# Patient Record
Sex: Female | Born: 1980
Health system: Southern US, Community
[De-identification: ages and names within clinical notes are randomized; demographics above are authoritative.]

## PROBLEM LIST (undated history)

## (undated) DIAGNOSIS — H547 Unspecified visual loss: Secondary | ICD-10-CM

## (undated) DIAGNOSIS — K219 Gastro-esophageal reflux disease without esophagitis: Secondary | ICD-10-CM

## (undated) DIAGNOSIS — R0600 Dyspnea, unspecified: Secondary | ICD-10-CM

## (undated) DIAGNOSIS — D649 Anemia, unspecified: Secondary | ICD-10-CM

## (undated) DIAGNOSIS — E538 Deficiency of other specified B group vitamins: Secondary | ICD-10-CM

## (undated) DIAGNOSIS — T7840XA Allergy, unspecified, initial encounter: Secondary | ICD-10-CM

## (undated) DIAGNOSIS — G4730 Sleep apnea, unspecified: Secondary | ICD-10-CM

## (undated) DIAGNOSIS — I10 Essential (primary) hypertension: Secondary | ICD-10-CM

## (undated) HISTORY — PX: TONSILLECTOMY: SUR1361

---
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
## 2013-12-09 HISTORY — PX: TUBAL LIGATION: SHX77

## 2018-02-04 ENCOUNTER — Emergency Department (HOSPITAL_COMMUNITY)
Admission: EM | Admit: 2018-02-04 | Discharge: 2018-02-04 | Disposition: A | Discharge status: Home / Self Care | Payer: Worker's Compensation | Attending: Emergency Medicine | Admitting: Emergency Medicine

## 2018-02-04 ENCOUNTER — Emergency Department (HOSPITAL_COMMUNITY): Payer: Worker's Compensation

## 2018-02-04 ENCOUNTER — Other Ambulatory Visit: Payer: Self-pay

## 2018-02-04 ENCOUNTER — Encounter (HOSPITAL_COMMUNITY): Payer: Self-pay | Admitting: Emergency Medicine

## 2018-02-04 DIAGNOSIS — I10 Essential (primary) hypertension: Secondary | ICD-10-CM | POA: Insufficient documentation

## 2018-02-04 DIAGNOSIS — Y9289 Other specified places as the place of occurrence of the external cause: Secondary | ICD-10-CM | POA: Diagnosis not present

## 2018-02-04 DIAGNOSIS — Y9389 Activity, other specified: Secondary | ICD-10-CM | POA: Diagnosis not present

## 2018-02-04 DIAGNOSIS — W273XXA Contact with needle (sewing), initial encounter: Secondary | ICD-10-CM | POA: Insufficient documentation

## 2018-02-04 DIAGNOSIS — Y990 Civilian activity done for income or pay: Secondary | ICD-10-CM | POA: Insufficient documentation

## 2018-02-04 DIAGNOSIS — S61349A Puncture wound with foreign body of unspecified finger with damage to nail, initial encounter: Secondary | ICD-10-CM | POA: Insufficient documentation

## 2018-02-04 HISTORY — DX: Unspecified visual loss: H54.7

## 2018-02-04 HISTORY — DX: Essential (primary) hypertension: I10

## 2018-02-04 LAB — RAPID URINE DRUG SCREEN, HOSP PERFORMED
Amphetamines: NOT DETECTED
BARBITURATES: NOT DETECTED
Benzodiazepines: NOT DETECTED
Cocaine: NOT DETECTED
Opiates: NOT DETECTED
Tetrahydrocannabinol: NOT DETECTED

## 2018-02-04 MED ORDER — HYDROCODONE-ACETAMINOPHEN 5-325 MG PO TABS
1.0000 | ORAL_TABLET | Freq: Once | ORAL | Status: AC
  Administered 2018-02-04: 1 via ORAL
  Filled 2018-02-04: qty 1

## 2018-02-04 MED ORDER — DOXYCYCLINE HYCLATE 100 MG PO CAPS: 100.0000 mg | ORAL_CAPSULE | Freq: Two times a day (BID) | ORAL | 0 refills | Status: DC

## 2018-02-04 MED ORDER — LIDOCAINE HCL (PF) 1 % IJ SOLN
5.0000 mL | Freq: Once | INTRAMUSCULAR | Status: AC
  Administered 2018-02-04: 5 mL via INTRADERMAL
  Filled 2018-02-04: qty 30

## 2018-02-04 MED ORDER — IBUPROFEN 800 MG PO TABS: 800.0000 mg | ORAL_TABLET | Freq: Three times a day (TID) | ORAL | 0 refills | Status: DC

## 2018-02-04 MED ORDER — HYDROCODONE-ACETAMINOPHEN 5-325 MG PO TABS: 1.0000 | ORAL_TABLET | Freq: Four times a day (QID) | ORAL | 0 refills | Status: DC | PRN

## 2018-02-04 NOTE — Discharge Instructions (Signed)
Take antibiotics as prescribed. Take the entire course, even if your symptoms improve.  Take ibuprofen 3 times a day with meals.  Do not take other anti-inflammatories at the same time open (Advil, Motrin, naproxen, Aleve).  Take norco as needed for severe pain.  Use ice packs as frequently as possible for pain and swelling.  Wear the finger splint for protection and as needed.  Follow up with Dr. Melvyn Novasrtmann for further evaluation.  Return to the ER if you develop fevers, chills, severe worsening pain, inability to move your finger, or any new or concerning symptoms.

## 2018-02-04 NOTE — ED Triage Notes (Signed)
Per [facility] pt coming from [facility], was using sewing machine and has needle in her right middle finger.

## 2018-02-04 NOTE — ED Notes (Signed)
Pt has some bloody drainage to rt middle finger foreign object is visibly impaled. Pt reports 8/10 aches thorbbing pain. Pt has hand in ice. Area is not actively bleeding at this time.

## 2018-02-04 NOTE — ED Notes (Signed)
Finger cleansed and wound care performed.

## 2018-02-04 NOTE — ED Provider Notes (Signed)
[facility]-EMERGENCY DEPT Provider Note   CSN: [id number] Arrival date & time: 02/04/18  1526     History   Chief Complaint No chief complaint on file.   HPI Alexandra Rose is a 37 y.o. female presenting for evaluation of sewing needle stuck in her nail.   Pt states she is blind, and works using a sewing machine. Today she had the needle pierce and break off in her finger. It is her R middle finger. She reports constant throbbing pain without radiation. She denies injury elsewhere.  She has not had anything for pain including Tylenol or ibuprofen.  She is not on blood thinners.  Tetanus was updated 3 years ago.  She is not immunocompromised. She denies numbness or inability to move her finger.   HPI  Past Medical History:  Diagnosis Date  . Blind   . Hypertension     There are no active problems to display for this patient.   History reviewed. No pertinent surgical history.  OB History    No data available       Home Medications    Prior to Admission medications   Medication Sig Start Date End Date Taking? Authorizing Provider  doxycycline (VIBRAMYCIN) 100 MG capsule Take 1 capsule (100 mg total) by mouth 2 (two) times daily. 02/04/18   Carling Liberman, PA-C  HYDROcodone-acetaminophen (NORCO/VICODIN) 5-325 MG tablet Take 1 tablet by mouth every 6 (six) hours as needed for severe pain. 02/04/18   Jalecia Leon, PA-C  ibuprofen (ADVIL,MOTRIN) 800 MG tablet Take 1 tablet (800 mg total) by mouth 3 (three) times daily with meals. 02/04/18   Oluwadarasimi Redmon, PA-C    Family History No family history on file.  Social History Social History   Tobacco Use  . Smoking status: Never Smoker  . Smokeless tobacco: Never Used  Substance Use Topics  . Alcohol use: Not on file  . Drug use: Not on file     Allergies   Patient has no known allergies.   Review of Systems Review of Systems  Skin: Positive for wound.  Hematological: Does not  bruise/bleed easily.     Physical Exam Updated Vital Signs BP (!) 152/112 (BP Location: Left Arm)   Pulse 95   Temp 98.2 F (36.8 C) (Oral)   Resp 18   Ht 5\' 7"  (1.702 m)   Wt 113.4 kg (250 lb)   LMP 01/19/2018   SpO2 100%   BMI 39.16 kg/m   Physical Exam  Constitutional: She is oriented to person, place, and time. She appears well-developed and well-nourished. No distress.  HENT:  Head: Normocephalic and atraumatic.  Eyes: EOM are normal.  Neck: Normal range of motion.  Pulmonary/Chest: Effort normal.  Abdominal: She exhibits no distension.  Musculoskeletal: Normal range of motion.  Full active range of motion of the fingers.  Strength against resistance intact.  Sensation intact.  Neurological: She is alert and oriented to person, place, and time. No sensory deficit.  Skin: Capillary refill takes less than 2 seconds.  Sewing needle puncturing the right nail.  No injury noted elsewhere.  No bleeding at this time. Good cap refill.  Psychiatric: She has a normal mood and affect.  Nursing note and vitals reviewed.    ED Treatments / Results  Labs (all labs ordered are listed, but only abnormal results are displayed) Labs Reviewed  RAPID URINE DRUG SCREEN, HOSP PERFORMED    EKG  EKG Interpretation None       Radiology  Dg Finger Middle Right  Result Date: 02/04/2018 CLINICAL DATA:  Patient coming from [facility], was using sewing machine and has needle in her right middle finger. EXAM: RIGHT MIDDLE FINGER 2+V COMPARISON:  None. FINDINGS: There is no evidence of fracture or dislocation. There is no evidence of arthropathy or other focal bone abnormality. There is a 7.5 mm metallic foreign body in tip of the right third digit which appears to violate distal-most cortex. IMPRESSION: 7.5 mm metallic foreign body in tip of the right third digit which appears to violate distal-most cortex. Electronically Signed   By: Elige Ko   On: 02/04/2018 16:28     Procedures .Foreign Body Removal Date/Time: 02/04/2018 6:19 PM Performed by: Alveria Apley, PA-C Authorized by: Alveria Apley, PA-C  Consent: Verbal consent obtained. Risks and benefits: risks, benefits and alternatives were discussed Consent given by: patient Intake: R middle finger. Anesthesia: digital block  Anesthesia: Local Anesthetic: lidocaine 1% without epinephrine Anesthetic total: 4 mL  Sedation: Patient sedated: no  Patient restrained: no Patient cooperative: yes Complexity: simple 1 objects recovered. Objects recovered: sweing needle Post-procedure assessment: foreign body removed Patient tolerance: Patient tolerated the procedure well with no immediate complications   (including critical care time)  Medications Ordered in ED Medications  lidocaine (PF) (XYLOCAINE) 1 % injection 5 mL (not administered)  HYDROcodone-acetaminophen (NORCO/VICODIN) 5-325 MG per tablet 1 tablet (1 tablet Oral Given 02/04/18 1612)     Initial Impression / Assessment and Plan / ED Course  I have reviewed the triage vital signs and the nursing notes.  Pertinent labs & imaging results that were available during my care of the patient were reviewed by me and considered in my medical decision making (see chart for details).     Patient presenting for evaluation of sewing needle puncturing the right nail.  Physical exam shows patient is neurovascularly intact.  X-ray shows sewing needle hitting the distal cortex of the bone.  Tetanus is UTD. Digital block performed, and sewing needle removed.  Patient placed on antibiotics and instructed on wound care.  Follow-up with hand surgery.  NSAIDs for pain and Norco for severe pain.  PMP checked, patient without previous prescription for narcotics.  At this time, patient appears safe for discharge.  Return precautions given.  Patient states she understands and agrees to plan.  Final Clinical Impressions(s) / ED Diagnoses   Final  diagnoses:  Puncture wound of finger with foreign body with damage to nail, initial encounter    ED Discharge Orders        Ordered    doxycycline (VIBRAMYCIN) 100 MG capsule  2 times daily     02/04/18 1746    ibuprofen (ADVIL,MOTRIN) 800 MG tablet  3 times daily with meals     02/04/18 1746    HYDROcodone-acetaminophen (NORCO/VICODIN) 5-325 MG tablet  Every 6 hours PRN     02/04/18 1746       Jamielynn Wigley, PA-C 02/04/18 1829    Donnetta Hutching, MD 02/06/18 2046

## 2018-05-01 DIAGNOSIS — H548 Legal blindness, as defined in USA: Secondary | ICD-10-CM | POA: Diagnosis not present

## 2018-05-01 DIAGNOSIS — Z0000 Encounter for general adult medical examination without abnormal findings: Secondary | ICD-10-CM | POA: Diagnosis not present

## 2018-05-01 DIAGNOSIS — Z13228 Encounter for screening for other metabolic disorders: Secondary | ICD-10-CM | POA: Diagnosis not present

## 2018-05-01 DIAGNOSIS — H3552 Pigmentary retinal dystrophy: Secondary | ICD-10-CM | POA: Diagnosis not present

## 2018-05-01 DIAGNOSIS — M549 Dorsalgia, unspecified: Secondary | ICD-10-CM | POA: Diagnosis not present

## 2018-05-01 DIAGNOSIS — N62 Hypertrophy of breast: Secondary | ICD-10-CM | POA: Diagnosis not present

## 2018-05-01 DIAGNOSIS — Z13220 Encounter for screening for lipoid disorders: Secondary | ICD-10-CM | POA: Diagnosis not present

## 2018-05-02 DIAGNOSIS — Z0000 Encounter for general adult medical examination without abnormal findings: Secondary | ICD-10-CM | POA: Diagnosis not present

## 2018-05-11 DIAGNOSIS — N62 Hypertrophy of breast: Secondary | ICD-10-CM | POA: Diagnosis not present

## 2018-10-13 DIAGNOSIS — R0681 Apnea, not elsewhere classified: Secondary | ICD-10-CM | POA: Diagnosis not present

## 2018-10-13 DIAGNOSIS — D509 Iron deficiency anemia, unspecified: Secondary | ICD-10-CM | POA: Diagnosis not present

## 2018-10-13 DIAGNOSIS — Z23 Encounter for immunization: Secondary | ICD-10-CM | POA: Diagnosis not present

## 2018-10-13 DIAGNOSIS — N939 Abnormal uterine and vaginal bleeding, unspecified: Secondary | ICD-10-CM | POA: Diagnosis not present

## 2018-10-23 DIAGNOSIS — D509 Iron deficiency anemia, unspecified: Secondary | ICD-10-CM | POA: Diagnosis not present

## 2018-10-23 DIAGNOSIS — N939 Abnormal uterine and vaginal bleeding, unspecified: Secondary | ICD-10-CM | POA: Diagnosis not present

## 2018-10-28 ENCOUNTER — Institutional Professional Consult (permissible substitution): Payer: Self-pay | Admitting: Pulmonary Disease

## 2018-10-28 DIAGNOSIS — R0602 Shortness of breath: Secondary | ICD-10-CM | POA: Diagnosis not present

## 2018-10-28 DIAGNOSIS — D500 Iron deficiency anemia secondary to blood loss (chronic): Secondary | ICD-10-CM | POA: Diagnosis not present

## 2018-10-28 DIAGNOSIS — R0789 Other chest pain: Secondary | ICD-10-CM | POA: Diagnosis not present

## 2018-10-28 DIAGNOSIS — D649 Anemia, unspecified: Secondary | ICD-10-CM | POA: Diagnosis not present

## 2018-11-02 DIAGNOSIS — D500 Iron deficiency anemia secondary to blood loss (chronic): Secondary | ICD-10-CM | POA: Diagnosis not present

## 2018-11-02 DIAGNOSIS — H3552 Pigmentary retinal dystrophy: Secondary | ICD-10-CM | POA: Diagnosis not present

## 2018-11-02 DIAGNOSIS — N921 Excessive and frequent menstruation with irregular cycle: Secondary | ICD-10-CM | POA: Diagnosis not present

## 2018-11-12 ENCOUNTER — Ambulatory Visit: Payer: Medicaid Other | Admitting: Obstetrics and Gynecology

## 2018-11-12 DIAGNOSIS — D500 Iron deficiency anemia secondary to blood loss (chronic): Secondary | ICD-10-CM | POA: Diagnosis not present

## 2018-11-19 DIAGNOSIS — D500 Iron deficiency anemia secondary to blood loss (chronic): Secondary | ICD-10-CM | POA: Diagnosis not present

## 2018-11-20 ENCOUNTER — Institutional Professional Consult (permissible substitution): Payer: Self-pay | Admitting: Pulmonary Disease

## 2018-11-20 DIAGNOSIS — D500 Iron deficiency anemia secondary to blood loss (chronic): Secondary | ICD-10-CM | POA: Diagnosis not present

## 2018-11-20 DIAGNOSIS — R040 Epistaxis: Secondary | ICD-10-CM | POA: Diagnosis not present

## 2018-11-30 ENCOUNTER — Encounter: Payer: Self-pay | Admitting: Advanced Practice Midwife

## 2018-11-30 ENCOUNTER — Ambulatory Visit (INDEPENDENT_AMBULATORY_CARE_PROVIDER_SITE_OTHER): Payer: BLUE CROSS/BLUE SHIELD | Admitting: Advanced Practice Midwife

## 2018-11-30 VITALS — BP 146/93 | HR 76 | Ht 67.0 in | Wt 312.0 lb

## 2018-11-30 DIAGNOSIS — N939 Abnormal uterine and vaginal bleeding, unspecified: Secondary | ICD-10-CM | POA: Diagnosis not present

## 2018-11-30 DIAGNOSIS — N920 Excessive and frequent menstruation with regular cycle: Secondary | ICD-10-CM | POA: Insufficient documentation

## 2018-11-30 DIAGNOSIS — D500 Iron deficiency anemia secondary to blood loss (chronic): Secondary | ICD-10-CM

## 2018-11-30 MED ORDER — MEGESTROL ACETATE 40 MG PO TABS: 40.0000 mg | ORAL_TABLET | Freq: Two times a day (BID) | ORAL | 3 refills | Status: DC

## 2018-11-30 NOTE — Patient Instructions (Signed)
Abnormal Uterine Bleeding  Abnormal uterine bleeding is unusual bleeding from the uterus. It includes:   Bleeding or spotting between periods.   Bleeding after sex.   Bleeding that is heavier than normal.   Periods that last longer than usual.   Bleeding after menopause.  Abnormal uterine bleeding can affect women at various stages in life, including teenagers, women in their reproductive years, pregnant women, and women who have reached menopause. Common causes of abnormal uterine bleeding include:   Pregnancy.   Growths of tissue (polyps).   A noncancerous tumor in the uterus (fibroid).   Infection.   Cancer.   Hormonal imbalances.  Any type of abnormal bleeding should be evaluated by a health care provider. Many cases are minor and simple to treat, while others are more serious. Treatment will depend on the cause of the bleeding.  Follow these instructions at home:   Monitor your condition for any changes.   Do not use tampons, douche, or have sex if told by your health care provider.   Change your pads often.   Get regular exams that include pelvic exams and cervical cancer screening.   Keep all follow-up visits as told by your health care provider. This is important.  Contact a health care provider if:   Your bleeding lasts for more than one week.   You feel dizzy at times.   You feel nauseous or you vomit.  Get help right away if:   You pass out.   Your bleeding soaks through a pad every hour.   You have abdominal pain.   You have a fever.   You become sweaty or weak.   You pass large blood clots from your vagina.  Summary   Abnormal uterine bleeding is unusual bleeding from the uterus.   Any type of abnormal bleeding should be evaluated by a health care provider. Many cases are minor and simple to treat, while others are more serious.   Treatment will depend on the cause of the bleeding.  This information is not intended to replace advice given to you by your health care provider.  Make sure you discuss any questions you have with your health care provider.  Document Released: 11/25/2005 Document Revised: 12/27/2016 Document Reviewed: 12/27/2016  Elsevier Interactive Patient Education  2019 [employer]

## 2018-11-30 NOTE — Progress Notes (Signed)
New Patient states that she has AUB. Pt reports heavy bleeding and clots with monthly cycle LMP 11-30-18. Pt states she changes pad 10-15 times a day. Hx of BTL 2014

## 2018-11-30 NOTE — Progress Notes (Signed)
  GYNECOLOGY PROGRESS NOTE  History:  37 y.o. G2P0 [facility] problem gyn visit. She reports heavy regular menses with some bleeding in between.  Her primary care doctor sent her to OB/Gyn because she has Hgb of 7 and is getting iron transfusions.  She is in a new relationship and is considering having tubal reversal surgery and planning another pregnancy.  She has heavy periods requiring 6-7 overnight pads every 24 hours that last for 3-4 days.  In between she sometimes has more bleeding but it is lighter than her period.  She reports some fatigue but no SOB, chest pain, or dizziness.    The following portions of the patient's history were reviewed and updated as appropriate: allergies, current medications, past family history, past medical history, past social history, past surgical history and problem list.   Review of Systems:  Pertinent items are noted in HPI.   Objective:  Physical Exam Blood pressure (!) 146/93, pulse 76, height 5\' 7"  (1.702 m), weight (!) 141.5 kg, last menstrual period 11/30/2018. VS reviewed, nursing note reviewed,  Constitutional: well developed, well nourished, no distress HEENT: normocephalic CV: normal rate Pulm/chest wall: normal effort Breast Exam: deferred Abdomen: soft Neuro: alert and oriented x 3 Skin: warm, dry Psych: affect normal Pelvic exam: Cervix pink, visually closed, without lesion, scant white creamy discharge, vaginal walls and external genitalia normal Bimanual exam: Cervix 0/long/high, firm, anterior, neg CMT, uterus nontender, nonenlarged, adnexa without tenderness, enlargement, or mass  Assessment & Plan:  1. Abnormal uterine bleeding (AUB) --Discussed options to manage AUB with pt.  She may desire another pregnancy.  She has HTN today in the office.  Pt has used IUD in past for contraception before BTL but IUD x 3 were expelled.   --Will start on Megace 40 mg BID. Pt to f/u in 2 months with MD.  If not improved,  consider endometrial biopsy. - megestrol (MEGACE) 40 MG tablet; Take 1 tablet (40 mg total) by mouth 2 (two) times daily.  Dispense: 30 tablet; Refill: 3  Sharen CounterLisa Leftwich-Kirby, CNM 3:50 PM

## 2018-12-10 ENCOUNTER — Institutional Professional Consult (permissible substitution): Payer: Self-pay | Admitting: Pulmonary Disease

## 2019-02-01 ENCOUNTER — Ambulatory Visit (INDEPENDENT_AMBULATORY_CARE_PROVIDER_SITE_OTHER): Payer: BLUE CROSS/BLUE SHIELD | Admitting: Obstetrics and Gynecology

## 2019-02-01 ENCOUNTER — Other Ambulatory Visit (HOSPITAL_COMMUNITY)
Admission: RE | Admit: 2019-02-01 | Discharge: 2019-02-01 | Disposition: A | Discharge status: Home / Self Care | Payer: BLUE CROSS/BLUE SHIELD | Source: Ambulatory Visit | Attending: Obstetrics and Gynecology | Admitting: Obstetrics and Gynecology

## 2019-02-01 ENCOUNTER — Encounter: Payer: Self-pay | Admitting: Obstetrics and Gynecology

## 2019-02-01 VITALS — BP 140/90 | Wt 310.0 lb

## 2019-02-01 DIAGNOSIS — Z131 Encounter for screening for diabetes mellitus: Secondary | ICD-10-CM | POA: Diagnosis not present

## 2019-02-01 DIAGNOSIS — Z124 Encounter for screening for malignant neoplasm of cervix: Secondary | ICD-10-CM | POA: Insufficient documentation

## 2019-02-01 DIAGNOSIS — Z1329 Encounter for screening for other suspected endocrine disorder: Secondary | ICD-10-CM | POA: Diagnosis not present

## 2019-02-01 DIAGNOSIS — N938 Other specified abnormal uterine and vaginal bleeding: Secondary | ICD-10-CM

## 2019-02-01 DIAGNOSIS — N898 Other specified noninflammatory disorders of vagina: Secondary | ICD-10-CM | POA: Diagnosis not present

## 2019-02-01 NOTE — Progress Notes (Signed)
Pt states she is still taking Megace and doing well. No AUB while taking medication.

## 2019-02-01 NOTE — Progress Notes (Signed)
38 yo here for follow up on AUB. She reports improvement in her DUB with megace. She reports amenorrhea since starting megace. She is still interested in tubal reversal and is in the process of saving for procedure. Patient reports some vaginal itching occasionally. Patient is in the process of losing weight  Past Medical History:  Diagnosis Date  . Blind   . Hypertension    Past Surgical History:  Procedure Laterality Date  . CESAREAN SECTION    . CESAREAN SECTION    . TONSILLECTOMY    . TUBAL LIGATION  2015   No family history on file. Social History   Tobacco Use  . Smoking status: Never Smoker  . Smokeless tobacco: Never Used  Substance Use Topics  . Alcohol use: Not on file  . Drug use: Not on file   ROS See pertinent in HPI  Blood pressure 140/90, weight (!) 310 lb (140.6 kg), last menstrual period 11/30/2018.  GENERAL: Well-developed, well-nourished female in no acute distress.  HEENT: Normocephalic, atraumatic. Sclerae anicteric.  NECK: Supple. Normal thyroid.  LUNGS: Clear to auscultation bilaterally.  HEART: Regular rate and rhythm. BREASTS: Symmetric in size. No palpable masses or lymphadenopathy, skin changes, or nipple drainage. ABDOMEN: Soft, nontender, nondistended. No organomegaly. PELVIC: Normal external female genitalia. Vagina is pink and rugated.  Normal discharge. Normal appearing cervix. Bimanual limited secondary to body habitus EXTREMITIES: No cyanosis, clubbing, or edema, 2+ distal pulses.  A/P 38 yo with DUB  - Continue medical management with Megace - Will refer patient to infertility specialist to start tubal reversal process - patient overdue for pap smear- performed today - wet prep collected - patient scheduled to see PCP tomorrow - Patient will be contacted with abnormal results - labs today - Encouraged patient to continue her weight loss efforts - RTC in 1 year or prn

## 2019-02-02 DIAGNOSIS — R040 Epistaxis: Secondary | ICD-10-CM | POA: Diagnosis not present

## 2019-02-02 DIAGNOSIS — D500 Iron deficiency anemia secondary to blood loss (chronic): Secondary | ICD-10-CM | POA: Diagnosis not present

## 2019-02-02 DIAGNOSIS — S161XXA Strain of muscle, fascia and tendon at neck level, initial encounter: Secondary | ICD-10-CM | POA: Diagnosis not present

## 2019-02-02 LAB — CBC
HEMATOCRIT: 37.2 % (ref 34.0–46.6)
Hemoglobin: 11.7 g/dL (ref 11.1–15.9)
MCH: 25.1 pg — AB (ref 26.6–33.0)
MCHC: 31.5 g/dL (ref 31.5–35.7)
MCV: 80 fL (ref 79–97)
PLATELETS: 369 10*3/uL (ref 150–450)
RBC: 4.67 x10E6/uL (ref 3.77–5.28)
RDW: 22.2 % — AB (ref 11.7–15.4)
WBC: 9.4 10*3/uL (ref 3.4–10.8)

## 2019-02-02 LAB — COMPREHENSIVE METABOLIC PANEL
A/G RATIO: 1.3 (ref 1.2–2.2)
ALT: 21 IU/L (ref 0–32)
AST: 17 IU/L (ref 0–40)
Albumin: 4.4 g/dL (ref 3.8–4.8)
Alkaline Phosphatase: 95 IU/L (ref 39–117)
BUN/Creatinine Ratio: 12 (ref 9–23)
BUN: 10 mg/dL (ref 6–20)
CHLORIDE: 105 mmol/L (ref 96–106)
CO2: 18 mmol/L — ABNORMAL LOW (ref 20–29)
Calcium: 9.6 mg/dL (ref 8.7–10.2)
Creatinine, Ser: 0.85 mg/dL (ref 0.57–1.00)
GFR calc non Af Amer: 88 mL/min/{1.73_m2} (ref >59)
GFR, EST AFRICAN AMERICAN: 101 mL/min/{1.73_m2} (ref >59)
GLOBULIN, TOTAL: 3.4 g/dL (ref 1.5–4.5)
Glucose: 115 mg/dL — ABNORMAL HIGH (ref 65–99)
POTASSIUM: 4.4 mmol/L (ref 3.5–5.2)
SODIUM: 144 mmol/L (ref 134–144)
TOTAL PROTEIN: 7.8 g/dL (ref 6.0–8.5)

## 2019-02-02 LAB — HEMOGLOBIN A1C
Est. average glucose Bld gHb Est-mCnc: 128 mg/dL
Hgb A1c MFr Bld: 6.1 % — ABNORMAL HIGH (ref 4.8–5.6)

## 2019-02-02 LAB — TSH: TSH: 2.29 u[IU]/mL (ref 0.450–4.500)

## 2019-02-03 LAB — CERVICOVAGINAL ANCILLARY ONLY
Bacterial vaginitis: POSITIVE — AB
Candida vaginitis: NEGATIVE

## 2019-02-03 MED ORDER — METRONIDAZOLE 500 MG PO TABS: 500.0000 mg | ORAL_TABLET | Freq: Two times a day (BID) | ORAL | 0 refills | Status: DC

## 2019-02-03 NOTE — Addendum Note (Signed)
Addended by: Catalina Antigua on: 02/03/2019 03:52 PM   Modules accepted: Orders

## 2019-02-04 LAB — CYTOLOGY - PAP
DIAGNOSIS: NEGATIVE
HPV: NOT DETECTED

## 2019-02-08 DIAGNOSIS — F17200 Nicotine dependence, unspecified, uncomplicated: Secondary | ICD-10-CM | POA: Diagnosis not present

## 2019-02-08 DIAGNOSIS — R040 Epistaxis: Secondary | ICD-10-CM | POA: Diagnosis not present

## 2019-02-08 DIAGNOSIS — N920 Excessive and frequent menstruation with regular cycle: Secondary | ICD-10-CM | POA: Diagnosis not present

## 2019-02-08 DIAGNOSIS — K5900 Constipation, unspecified: Secondary | ICD-10-CM | POA: Diagnosis not present

## 2019-02-08 DIAGNOSIS — D500 Iron deficiency anemia secondary to blood loss (chronic): Secondary | ICD-10-CM | POA: Diagnosis not present

## 2019-02-09 DIAGNOSIS — Z6841 Body Mass Index (BMI) 40.0 and over, adult: Secondary | ICD-10-CM | POA: Diagnosis not present

## 2019-02-09 DIAGNOSIS — G4733 Obstructive sleep apnea (adult) (pediatric): Secondary | ICD-10-CM | POA: Diagnosis not present

## 2019-02-09 DIAGNOSIS — R040 Epistaxis: Secondary | ICD-10-CM | POA: Diagnosis not present

## 2019-02-17 ENCOUNTER — Encounter: Payer: Self-pay | Admitting: Internal Medicine

## 2019-02-17 ENCOUNTER — Telehealth: Payer: Self-pay | Admitting: Internal Medicine

## 2019-02-17 NOTE — Telephone Encounter (Signed)
Received a call from [facility] to schedule a hem appt for Alexandra Rose. An appt has been scheduled for the pt to see Dr. Melton Alar on 6/18 at 1050am. Referring office will notify the pt. Letter mailed.

## 2019-02-24 DIAGNOSIS — N760 Acute vaginitis: Secondary | ICD-10-CM | POA: Diagnosis not present

## 2019-02-24 DIAGNOSIS — R1084 Generalized abdominal pain: Secondary | ICD-10-CM | POA: Diagnosis not present

## 2019-02-24 DIAGNOSIS — K5909 Other constipation: Secondary | ICD-10-CM | POA: Diagnosis not present

## 2019-02-24 DIAGNOSIS — D500 Iron deficiency anemia secondary to blood loss (chronic): Secondary | ICD-10-CM | POA: Diagnosis not present

## 2019-03-15 DIAGNOSIS — G4733 Obstructive sleep apnea (adult) (pediatric): Secondary | ICD-10-CM | POA: Diagnosis not present

## 2019-03-15 DIAGNOSIS — R040 Epistaxis: Secondary | ICD-10-CM | POA: Diagnosis not present

## 2019-03-15 DIAGNOSIS — R0609 Other forms of dyspnea: Secondary | ICD-10-CM | POA: Diagnosis not present

## 2019-03-15 DIAGNOSIS — R0789 Other chest pain: Secondary | ICD-10-CM | POA: Diagnosis not present

## 2019-03-23 DIAGNOSIS — Z3169 Encounter for other general counseling and advice on procreation: Secondary | ICD-10-CM | POA: Diagnosis not present

## 2019-04-12 DIAGNOSIS — D509 Iron deficiency anemia, unspecified: Secondary | ICD-10-CM | POA: Diagnosis not present

## 2019-04-13 DIAGNOSIS — R0600 Dyspnea, unspecified: Secondary | ICD-10-CM | POA: Diagnosis not present

## 2019-04-13 DIAGNOSIS — D500 Iron deficiency anemia secondary to blood loss (chronic): Secondary | ICD-10-CM | POA: Diagnosis not present

## 2019-04-13 DIAGNOSIS — R040 Epistaxis: Secondary | ICD-10-CM | POA: Diagnosis not present

## 2019-04-13 DIAGNOSIS — J301 Allergic rhinitis due to pollen: Secondary | ICD-10-CM | POA: Diagnosis not present

## 2019-04-25 DIAGNOSIS — D500 Iron deficiency anemia secondary to blood loss (chronic): Secondary | ICD-10-CM | POA: Diagnosis not present

## 2019-05-04 DIAGNOSIS — D500 Iron deficiency anemia secondary to blood loss (chronic): Secondary | ICD-10-CM | POA: Diagnosis not present

## 2019-05-26 ENCOUNTER — Telehealth: Payer: Self-pay | Admitting: Internal Medicine

## 2019-05-26 NOTE — Telephone Encounter (Signed)
Pt cld and r/s appt from 6/18 to 7/2 at 1pm.

## 2019-05-27 ENCOUNTER — Encounter: Payer: Self-pay | Admitting: Internal Medicine

## 2019-05-27 ENCOUNTER — Inpatient Hospital Stay: Payer: BC Managed Care – PPO

## 2019-05-28 DIAGNOSIS — G4733 Obstructive sleep apnea (adult) (pediatric): Secondary | ICD-10-CM | POA: Diagnosis not present

## 2019-05-28 DIAGNOSIS — Z6841 Body Mass Index (BMI) 40.0 and over, adult: Secondary | ICD-10-CM | POA: Diagnosis not present

## 2019-05-28 DIAGNOSIS — H543 Unqualified visual loss, both eyes: Secondary | ICD-10-CM | POA: Diagnosis not present

## 2019-06-04 DIAGNOSIS — D500 Iron deficiency anemia secondary to blood loss (chronic): Secondary | ICD-10-CM | POA: Diagnosis not present

## 2019-06-04 DIAGNOSIS — R5383 Other fatigue: Secondary | ICD-10-CM | POA: Diagnosis not present

## 2019-06-04 DIAGNOSIS — Z319 Encounter for procreative management, unspecified: Secondary | ICD-10-CM | POA: Diagnosis not present

## 2019-06-04 DIAGNOSIS — G4733 Obstructive sleep apnea (adult) (pediatric): Secondary | ICD-10-CM | POA: Diagnosis not present

## 2019-06-04 DIAGNOSIS — N939 Abnormal uterine and vaginal bleeding, unspecified: Secondary | ICD-10-CM | POA: Diagnosis not present

## 2019-06-08 DIAGNOSIS — R5383 Other fatigue: Secondary | ICD-10-CM | POA: Diagnosis not present

## 2019-06-10 ENCOUNTER — Inpatient Hospital Stay: Payer: BC Managed Care – PPO | Attending: Internal Medicine | Admitting: Internal Medicine

## 2019-06-10 ENCOUNTER — Other Ambulatory Visit: Payer: Self-pay

## 2019-06-10 ENCOUNTER — Inpatient Hospital Stay: Payer: BC Managed Care – PPO

## 2019-06-10 ENCOUNTER — Encounter: Payer: Self-pay | Admitting: Internal Medicine

## 2019-06-10 VITALS — BP 139/95 | HR 100 | Temp 98.5°F | Resp 18 | Ht 67.0 in | Wt 320.8 lb

## 2019-06-10 DIAGNOSIS — D500 Iron deficiency anemia secondary to blood loss (chronic): Secondary | ICD-10-CM | POA: Insufficient documentation

## 2019-06-10 DIAGNOSIS — N920 Excessive and frequent menstruation with regular cycle: Secondary | ICD-10-CM

## 2019-06-10 DIAGNOSIS — F17210 Nicotine dependence, cigarettes, uncomplicated: Secondary | ICD-10-CM

## 2019-06-10 DIAGNOSIS — I10 Essential (primary) hypertension: Secondary | ICD-10-CM | POA: Insufficient documentation

## 2019-06-10 DIAGNOSIS — H5360 Unspecified night blindness: Secondary | ICD-10-CM | POA: Insufficient documentation

## 2019-06-10 DIAGNOSIS — H3552 Pigmentary retinal dystrophy: Secondary | ICD-10-CM | POA: Diagnosis not present

## 2019-06-10 DIAGNOSIS — D508 Other iron deficiency anemias: Secondary | ICD-10-CM

## 2019-06-10 LAB — CMP (CANCER CENTER ONLY)
ALT: 27 U/L (ref 0–44)
AST: 18 U/L (ref 15–41)
Albumin: 3.7 g/dL (ref 3.5–5.0)
Alkaline Phosphatase: 103 U/L (ref 38–126)
Anion gap: 12 (ref 5–15)
BUN: 9 mg/dL (ref 6–20)
CO2: 21 mmol/L — ABNORMAL LOW (ref 22–32)
Calcium: 8.7 mg/dL — ABNORMAL LOW (ref 8.9–10.3)
Chloride: 108 mmol/L (ref 98–111)
Creatinine: 0.92 mg/dL (ref 0.44–1.00)
GFR, Est AFR Am: >60 mL/min (ref >60)
GFR, Estimated: >60 mL/min (ref >60)
Glucose, Bld: 104 mg/dL — ABNORMAL HIGH (ref 70–99)
Potassium: 3.6 mmol/L (ref 3.5–5.1)
Sodium: 141 mmol/L (ref 135–145)
Total Bilirubin: 0.3 mg/dL (ref 0.3–1.2)
Total Protein: 8.5 g/dL — ABNORMAL HIGH (ref 6.5–8.1)

## 2019-06-10 LAB — CBC WITH DIFFERENTIAL (CANCER CENTER ONLY)
Abs Immature Granulocytes: 0.04 10*3/uL (ref 0.00–0.07)
Basophils Absolute: 0 10*3/uL (ref 0.0–0.1)
Basophils Relative: 0 %
Eosinophils Absolute: 0.1 10*3/uL (ref 0.0–0.5)
Eosinophils Relative: 1 %
HCT: 40.3 % (ref 36.0–46.0)
Hemoglobin: 12.5 g/dL (ref 12.0–15.0)
Immature Granulocytes: 0 %
Lymphocytes Relative: 24 %
Lymphs Abs: 2.3 10*3/uL (ref 0.7–4.0)
MCH: 27.2 pg (ref 26.0–34.0)
MCHC: 31 g/dL (ref 30.0–36.0)
MCV: 87.8 fL (ref 80.0–100.0)
Monocytes Absolute: 0.4 10*3/uL (ref 0.1–1.0)
Monocytes Relative: 4 %
Neutro Abs: 6.6 10*3/uL (ref 1.7–7.7)
Neutrophils Relative %: 71 %
Platelet Count: 299 10*3/uL (ref 150–400)
RBC: 4.59 MIL/uL (ref 3.87–5.11)
RDW: 17 % — ABNORMAL HIGH (ref 11.5–15.5)
WBC Count: 9.4 10*3/uL (ref 4.0–10.5)
nRBC: 0 % (ref 0.0–0.2)

## 2019-06-10 LAB — IRON AND TIBC
Iron: 47 ug/dL (ref 41–142)
Saturation Ratios: 16 % — ABNORMAL LOW (ref 21–57)
TIBC: 286 ug/dL (ref 236–444)
UIBC: 240 ug/dL (ref 120–384)

## 2019-06-10 LAB — LACTATE DEHYDROGENASE: LDH: 221 U/L — ABNORMAL HIGH (ref 98–192)

## 2019-06-10 LAB — VITAMIN B12: Vitamin B-12: 149 pg/mL — ABNORMAL LOW (ref 180–914)

## 2019-06-10 LAB — FOLATE: Folate: 9.8 ng/mL (ref >5.9)

## 2019-06-10 LAB — FERRITIN: Ferritin: 1018 ng/mL — ABNORMAL HIGH (ref 11–307)

## 2019-06-10 NOTE — Progress Notes (Signed)
Referring Physician:  Ralene OkSarah Gordon, PA-C and Dr. Rosezella RumpfSandrine Crane  Diagnosis Other iron deficiency anemia - Plan: CBC with Differential (Cancer Center Only), CMP (Cancer Center only), Lactate dehydrogenase (LDH), Ferritin, Iron and TIBC, Folate, Serum, Vitamin B12, Methylmalonic acid, serum, SPEP with reflex to IFE, Hemoglobinopathy evaluation  Staging Cancer Staging No matching staging information was found for the patient.  Assessment and Plan:  1.  Iron deficiency anemia.  [age] year old female referred for evaluation due to iron deficiency anemia.  Pt was [facility] by Dr. Neil Crouchrane and was last seen 02/08/2019 and was [facility]. Pt has a diagnosis of  iron deficiency felt secondary to menorrhagia. She is status post Injectafer.  She reports her LMP was in 10/2018.  She reportedly has undergone colon[place] in [place] several years ago.  She reports craving ice.  Labs done 02/01/2019 showed WBC 9.4 HB 11.7 plts 369,000.  MCV 80.  Chemistries showed K+ 4.4 Cr 0.85 normal LFTs.   Pt reports she lost her eyesight at 8 yoa due to retinitis pigmentosa.  She denies any blood in stool or urine.  Pt is seen today for consultation due to iron deficiency anemia.    Labs done today 06/10/2019  reviewed and showed WBC 9.4 HB 12.5 plts 299,000.  MCV 88.  Chemistries showed K+ 3.6 Cr 0.92 and normal LFTs.  Awaiting results of iron studies, B12, folate, MMA, SPEP and HB electrophoresis.   Pt will have phone visit follow-up in 2 weeks to go over results.  Currently HB adequate at 12.5.  May refer to GI pending iron results.    2.  Menorrhagia.  Pt reports LMP in 10/2018.  She is currently on megace.  Follow-up with GYN as directed.    3.  Blindness.  Pt reports she lost eyesight at 38 years of age due to Retinitis Pigmentosa.  Pt should follow-up with ophthalmology as directed.    4.  Pica.  Pt reports craving ice.  Likely due to history of IDA.    5.  Smoking.  She  reports this is rare.  Cessation is recommended.  Follow-up with PCP.    6.  Health maintenance.  Follow-up with PCP as directed.  Pending lab results, may refer to GI.    38 minutes spent with more than 50% spent in review of records, counseling and coordination of care.     Oncology History   No history exists.   HPI:  38 year old female referred for evaluation due to iron deficiency anemia.  [facility] was [facility] by [facility]. Neil Crouchrane and was last seen 02/08/2019 and was [facility]. Pt has a diagnosis of  Iron deficiency felt secondary to menorrhagia. She is status post Injectafer.  She reports her LMP was in 10/2018.  She reportedly has undergone colonoscopy in [place] several years ago.  She reports craving ice.  Labs done 02/01/2019 showed WBC 9.4 HB 11.7 plts 369,000.  MCV 80.  Chemistries showed K+ 4.4 Cr 0.85 normal LFTs.   Pt reports she lost her eyesight at 8 yoa due to retinitis pigmentosa.  She denies any blood in stool or urine.  Pt is seen today for consultation due to iron deficiency anemia.    Problem List Patient Active Problem List   Diagnosis Date Noted  . Anemia due to chronic blood loss [D50.0] 11/30/2018  . Abnormal uterine bleeding (AUB) [N93.9] 11/30/2018  . Menorrhagia [N92.0] 11/30/2018  Past Medical History Past Medical History:  Diagnosis Date  . Blind   . Hypertension     Past Surgical History Past Surgical History:  Procedure Laterality Date  . CESAREAN SECTION    . CESAREAN SECTION    . TONSILLECTOMY    . TUBAL LIGATION  2015    Family History History reviewed. No pertinent family history.   Social History  reports that she has been smoking. She has never used smokeless tobacco. She reports current alcohol use. She reports previous drug use.  Medications  Current Outpatient Medications:  .  Ascorbic Acid (VITAMIN C) 100 MG tablet, Vitamin C, Disp: , Rfl:  .  cetirizine (ZYRTEC) 10 MG tablet, Take by  mouth., Disp: , Rfl:  .  megestrol (MEGACE) 40 MG tablet, Take 1 tablet (40 mg total) by mouth 2 (two) times daily., Disp: 30 tablet, Rfl: 3 .  polyethylene glycol (MIRALAX / GLYCOLAX) 17 g packet, TK 17 G PO D FOR 3 DAYS, Disp: , Rfl:   Allergies Tomato and Shellfish allergy  Review of Systems Review of Systems - Oncology ROS negative other than fatigue and craving ice.     Physical Exam  Vitals Wt Readings from Last 3 Encounters:  06/10/19 (!) 320 lb 12.8 oz (145.5 kg)  02/01/19 (!) 310 lb (140.6 kg)  11/30/18 (!) 312 lb (141.5 kg)   Temp Readings from Last 3 Encounters:  06/10/19 98.5 F (36.9 C) (Oral)  02/04/18 98.2 F (36.8 C) (Oral)   BP Readings from Last 3 Encounters:  06/10/19 (!) 139/95  02/01/19 140/90  11/30/18 (!) 146/93   Pulse Readings from Last 3 Encounters:  06/10/19 100  11/30/18 76  02/04/18 95   Constitutional: Well-developed, well-nourished, and in no distress.   HENT: Head: Normocephalic and atraumatic.  Mouth/Throat: No oropharyngeal exudate. Mucosa moist. Eyes: No scleral icterus. Pt is blind.   Neck: Normal range of motion. Neck supple. No JVD present.  Cardiovascular: Normal rate, regular rhythm and normal heart sounds.  Exam reveals no gallop and no friction rub.   No murmur heard. Pulmonary/Chest: Effort normal and breath sounds normal. No respiratory distress. No wheezes.No rales.  Abdominal: Soft. Obese.  Bowel sounds are normal. No distension. There is no tenderness. There is no guarding.  Musculoskeletal: No edema or tenderness.  Lymphadenopathy: No cervical,axillary or supraclavicular adenopathy.  Neurological: Alert and oriented to person, place, and time. No cranial nerve deficit.  Skin: Skin is warm and dry. No rash noted. No erythema. No pallor.  Psychiatric: Affect and judgment normal.   Labs Appointment on 06/10/2019  Component Date Value Ref Range Status  . Sodium 06/10/2019 141  135 - 145 mmol/L Final  . Potassium  06/10/2019 3.6  3.5 - 5.1 mmol/L Final  . Chloride 06/10/2019 108  98 - 111 mmol/L Final  . CO2 06/10/2019 21* 22 - 32 mmol/L Final  . Glucose, Bld 06/10/2019 104* 70 - 99 mg/dL Final  . BUN 06/10/2019 9  6 - 20 mg/dL Final  . Creatinine 06/10/2019 0.92  0.44 - 1.00 mg/dL Final  . Calcium 06/10/2019 8.7* 8.9 - 10.3 mg/dL Final  . Total Protein 06/10/2019 8.5* 6.5 - 8.1 g/dL Final  . Albumin 06/10/2019 3.7  3.5 - 5.0 g/dL Final  . AST 06/10/2019 18  15 - 41 U/L Final  . ALT 06/10/2019 27  0 - 44 U/L Final  . Alkaline Phosphatase 06/10/2019 103  38 - 126 U/L Final  . Total Bilirubin 06/10/2019 0.3  0.3 - 1.2 mg/dL Final  . GFR, Est Non Af Am 06/10/2019 >60  >60 mL/min Final  . GFR, Est AFR Am 06/10/2019 >60  >60 mL/min Final  . Anion gap 06/10/2019 12  5 - 15 Final   Performed at [facility], [address] [place], [place] [address]  . WBC Count 06/10/2019 9.4  4.0 - 10.5 K/uL Final  . RBC 06/10/2019 4.59  3.87 - 5.11 MIL/uL Final  . Hemoglobin 06/10/2019 12.5  12.0 - 15.0 g/dL Final  . HCT 60/45/409807/01/2019 40.3  36.0 - 46.0 % Final  . MCV 06/10/2019 87.8  80.0 - 100.0 fL Final  . MCH 06/10/2019 27.2  26.0 - 34.0 pg Final  . MCHC 06/10/2019 31.0  30.0 - 36.0 g/dL Final  . RDW 11/91/478207/01/2019 17.0* 11.5 - 15.5 % Final  . Platelet Count 06/10/2019 299  150 - 400 K/uL Final  . nRBC 06/10/2019 0.0  0.0 - 0.2 % Final  . Neutrophils Relative % 06/10/2019 71  % Final  . Neutro Abs 06/10/2019 6.6  1.7 - 7.7 K/uL Final  . Lymphocytes Relative 06/10/2019 24  % Final  . Lymphs Abs 06/10/2019 2.3  0.7 - 4.0 K/uL Final  . Monocytes Relative 06/10/2019 4  % Final  . Monocytes Absolute 06/10/2019 0.4  0.1 - 1.0 K/uL Final  . Eosinophils Relative 06/10/2019 1  % Final  . Eosinophils Absolute 06/10/2019 0.1  0.0 - 0.5 K/uL Final  . Basophils Relative 06/10/2019 0  % Final  . Basophils Absolute 06/10/2019 0.0  0.0 - 0.1 K/uL Final  . Immature Granulocytes 06/10/2019 0  % Final  .  Abs Immature Granulocytes 06/10/2019 0.04  0.00 - 0.07 K/uL Final   Performed at [facility], [address] [place], [place] [address]     Pathology Orders Placed This Encounter  Procedures  . CBC with Differential (Cancer Center Only)    Standing Status:   Future    Number of Occurrences:   1    Standing Expiration Date:   06/09/2020  . CMP (Cancer Center only)    Standing Status:   Future    Number of Occurrences:   1    Standing Expiration Date:   06/09/2020  . Lactate dehydrogenase (LDH)    Standing Status:   Future    Number of Occurrences:   1    Standing Expiration Date:   06/09/2020  . Ferritin    Standing Status:   Future    Number of Occurrences:   1    Standing Expiration Date:   06/09/2020  . Iron and TIBC    Standing Status:   Future    Number of Occurrences:   1    Standing Expiration Date:   06/09/2020  . Folate, Serum    Standing Status:   Future    Number of Occurrences:   1    Standing Expiration Date:   06/09/2020  . Vitamin B12    Standing Status:   Future    Number of Occurrences:   1    Standing Expiration Date:   06/09/2020  . Methylmalonic acid, serum    Standing Status:   Future    Number of Occurrences:   1    Standing Expiration Date:   06/09/2020  . SPEP with reflex to IFE    Standing Status:   Future    Number of Occurrences:   1    Standing  Expiration Date:   06/09/2020  . Hemoglobinopathy evaluation    Standing Status:   Future    Number of Occurrences:   1    Standing Expiration Date:   06/09/2020       Ahmed PrimaVetta Tomasina Keasling MD

## 2019-06-10 NOTE — Patient Instructions (Signed)
Anemia  Anemia is a condition in which you do not have enough red blood cells or hemoglobin. Hemoglobin is a substance in red blood cells that carries oxygen. When you do not have enough red blood cells or hemoglobin (are anemic), your body cannot get enough oxygen and your organs may not work properly. As a result, you may feel very tired or have other problems. What are the causes? Common causes of anemia include:  Excessive bleeding. Anemia can be caused by excessive bleeding inside or outside the body, including bleeding from the intestine or from periods in women.  Poor nutrition.  Long-lasting (chronic) kidney, thyroid, and liver disease.  Bone marrow disorders.  Cancer and treatments for cancer.  HIV (human immunodeficiency virus) and AIDS (acquired immunodeficiency syndrome).  Treatments for HIV and AIDS.  Spleen problems.  Blood disorders.  Infections, medicines, and autoimmune disorders that destroy red blood cells. What are the signs or symptoms? Symptoms of this condition include:  Minor weakness.  Dizziness.  Headache.  Feeling heartbeats that are irregular or faster than normal (palpitations).  Shortness of breath, especially with exercise.  Paleness.  Cold sensitivity.  Indigestion.  Nausea.  Difficulty sleeping.  Difficulty concentrating. Symptoms may occur suddenly or develop slowly. If your anemia is mild, you may not have symptoms. How is this diagnosed? This condition is diagnosed based on:  Blood tests.  Your medical history.  A physical exam.  Bone marrow biopsy. Your health care provider may also check your stool (feces) for blood and may do additional testing to look for the cause of your bleeding. You may also have other tests, including:  Imaging tests, such as a CT scan or MRI.  Endoscopy.  Colonoscopy. How is this treated? Treatment for this condition depends on the cause. If you continue to lose a lot of blood, you may  need to be treated at a hospital. Treatment may include:  Taking supplements of iron, vitamin S31, or folic acid.  Taking a hormone medicine (erythropoietin) that can help to stimulate red blood cell growth.  Having a blood transfusion. This may be needed if you lose a lot of blood.  Making changes to your diet.  Having surgery to remove your spleen. Follow these instructions at home:  Take over-the-counter and prescription medicines only as told by your health care provider.  Take supplements only as told by your health care provider.  Follow any diet instructions that you were given.  Keep all follow-up visits as told by your health care provider. This is important. Contact a health care provider if:  You develop new bleeding anywhere in the body. Get help right away if:  You are very weak.  You are short of breath.  You have pain in your abdomen or chest.  You are dizzy or feel faint.  You have trouble concentrating.  You have bloody or black, tarry stools.  You vomit repeatedly or you vomit up blood. Summary  Anemia is a condition in which you do not have enough red blood cells or enough of a substance in your red blood cells that carries oxygen (hemoglobin).  Symptoms may occur suddenly or develop slowly.  If your anemia is mild, you may not have symptoms.  This condition is diagnosed with blood tests as well as a medical history and physical exam. Other tests may be needed.  Treatment for this condition depends on the cause of the anemia. This information is not intended to replace advice given to you by  your health care provider. Make sure you discuss any questions you have with your health care provider. Document Released: 01/02/2005 Document Revised: 11/07/2017 Document Reviewed: 12/27/2016 Elsevier Patient Education  2020 [employer].

## 2019-06-12 LAB — METHYLMALONIC ACID, SERUM: Methylmalonic Acid, Quantitative: 273 nmol/L (ref 0–378)

## 2019-06-14 ENCOUNTER — Telehealth: Payer: Self-pay | Admitting: Internal Medicine

## 2019-06-14 LAB — PROTEIN ELECTROPHORESIS, SERUM, WITH REFLEX
A/G Ratio: 0.9 (ref 0.7–1.7)
Albumin ELP: 3.7 g/dL (ref 2.9–4.4)
Alpha-1-Globulin: 0.3 g/dL (ref 0.0–0.4)
Alpha-2-Globulin: 1.3 g/dL — ABNORMAL HIGH (ref 0.4–1.0)
Beta Globulin: 1.3 g/dL (ref 0.7–1.3)
Gamma Globulin: 1.2 g/dL (ref 0.4–1.8)
Globulin, Total: 4.2 g/dL — ABNORMAL HIGH (ref 2.2–3.9)
Total Protein ELP: 7.9 g/dL (ref 6.0–8.5)

## 2019-06-14 LAB — HEMOGLOBINOPATHY EVALUATION
Hgb A2 Quant: 2.6 % (ref 1.8–3.2)
Hgb A: 97.4 % (ref 96.4–98.8)
Hgb C: 0 %
Hgb F Quant: 0 % (ref 0.0–2.0)
Hgb S Quant: 0 %
Hgb Variant: 0 %

## 2019-06-14 NOTE — Telephone Encounter (Signed)
Called and spoke with patient. Confirmed date and time of phone visit  ° °

## 2019-06-23 ENCOUNTER — Telehealth: Payer: Self-pay | Admitting: Internal Medicine

## 2019-06-23 NOTE — Telephone Encounter (Signed)
Left voicemail to confirm appt and verify info. °

## 2019-06-24 ENCOUNTER — Inpatient Hospital Stay (HOSPITAL_BASED_OUTPATIENT_CLINIC_OR_DEPARTMENT_OTHER): Payer: BC Managed Care – PPO | Admitting: Internal Medicine

## 2019-06-24 ENCOUNTER — Encounter: Payer: Self-pay | Admitting: Internal Medicine

## 2019-06-24 DIAGNOSIS — F17210 Nicotine dependence, cigarettes, uncomplicated: Secondary | ICD-10-CM

## 2019-06-24 DIAGNOSIS — N920 Excessive and frequent menstruation with regular cycle: Secondary | ICD-10-CM

## 2019-06-24 DIAGNOSIS — G4733 Obstructive sleep apnea (adult) (pediatric): Secondary | ICD-10-CM | POA: Diagnosis not present

## 2019-06-24 DIAGNOSIS — D508 Other iron deficiency anemias: Secondary | ICD-10-CM

## 2019-06-24 DIAGNOSIS — E538 Deficiency of other specified B group vitamins: Secondary | ICD-10-CM

## 2019-06-24 HISTORY — DX: Deficiency of other specified B group vitamins: E53.8

## 2019-06-24 NOTE — Progress Notes (Signed)
Virtual Visit via Telephone Note  I connected with Alexandra Rose on 06/24/19 at  8:50 AM EDT by telephone and verified that I am speaking with the correct person using two identifiers.   I discussed the limitations, risks, security and privacy concerns of performing an evaluation and management service by telephone and the availability of in person appointments. I also discussed with the patient that there may be a patient responsible charge related to this service. The patient expressed understanding and agreed to proceed.  Interval History:  Historical data obtained from note dated 06/10/2019.  38 year old female referred for evaluation due to iron deficiency anemia.  Pt was previously followed at [place] by Dr. Marylou Mccoy and was last seen 02/08/2019 and was treated with [facility] 05/04/2019. Pt has a diagnosis of  Iron deficiency felt secondary to menorrhagia. She is status post Injectafer.  She reports her LMP was in 10/2018.  She reportedly has undergone colonoscopy in [place] several years ago.  She reports craving ice.  Labs done 02/01/2019 showed WBC 9.4 HB 11.7 plts 369,000.  MCV 80.  Chemistries showed K+ 4.4 Cr 0.85 normal LFTs.   Pt reports she lost her eyesight at 8 yoa due to retinitis pigmentosa.  She denies any blood in stool or urine.  Pt is seen today for consultation due to iron deficiency anemia.    Observations/Objective: Review of labs    Assessment and Plan: 1.  Iron deficiency anemia.  38 year old female referred for evaluation due to iron deficiency anemia.  Pt was previously followed at [place] by Dr. Marylou Mccoy and was last seen 02/08/2019 and was treated with [facility] 05/04/2019. Pt has a diagnosis of  iron deficiency felt secondary to menorrhagia. She is status post Injectafer.  She reports her LMP was in 10/2018.  She reportedly has undergone colonoscopy in [place] several years ago.  She reports craving ice.  Labs done 02/01/2019 showed WBC 9.4 HB 11.7 plts 369,000.  MCV  80.  Chemistries showed K+ 4.4 Cr 0.85 normal LFTs.   Pt reports she lost her eyesight at 8 yoa due to retinitis pigmentosa.  She denies any blood in stool or urine.      Labs done  06/10/2019  reviewed and showed WBC 9.4 HB 12.5 plts 299,000.  MCV 88.  Chemistries showed K+ 3.6 Cr 0.92 and normal LFTs.  Ferritin > 1000.  She has normal SPEP and HB electrophoresis.    I discussed with pt HB is WNL at 12.5 and ferritin is > 1000.  Currently she would not be recommended for additional IV iron.  Pt will have repeat labs in 10/2019 and will follow-up at that time to go over results.  Pt reports she had EGD and colonoscopy done 15 years ago.  Will refer to GI.   2.  B12 deficiency.  B12 levels are decreased at 149.  MMA WNL.  Pt will be treated with B12 1000 mcg monthly.  She will have repeat labs in 10/2019.  Pt is referred to GI.     3.  Menorrhagia.  Pt reports LMP in 10/2018.  She is currently on megace.  Follow-up with GYN as directed.  Pt reports cycles have improved.    4.  Blindness.  Pt reports she lost eyesight at 39 years of age due to Retinitis Pigmentosa.  Pt should follow-up with ophthalmology as directed.    5.  Smoking.  She reports this is rare.  Cessation is recommended.  Follow-up with  PCP.    6.  Request for IV catheter.  Pt reports at [facility] she was a difficult stick and is requesting a catheter.  I discussed with her that currently levels are WNL and due to the infrequent use of IV iron, currently she is not recommended for catheter placement.    7.  Health maintenance.  Follow-up with PCP as directed.  Pt referred to GI due to IDA and B12 deficiency.     Follow Up Instructions: B12 monthly.  Refer to GI.  Labs and follow-up 10/2019.      I discussed the assessment and treatment plan with the patient. The patient was provided an opportunity to ask questions and all were answered. The patient agreed with the plan and demonstrated an understanding of the instructions.   The  patient was advised to call back or seek an in-person evaluation if the symptoms worsen or if the condition fails to improve as anticipated.  I provided 21 minutes of non-face-to-face time during this encounter.   Ahmed PrimaVetta Evanee Lubrano, MD

## 2019-06-28 ENCOUNTER — Telehealth: Payer: Self-pay | Admitting: Internal Medicine

## 2019-06-28 NOTE — Telephone Encounter (Signed)
Called and left msg. Mailed printout  °

## 2019-06-29 DIAGNOSIS — N971 Female infertility of tubal origin: Secondary | ICD-10-CM | POA: Diagnosis not present

## 2019-07-19 ENCOUNTER — Inpatient Hospital Stay: Payer: BC Managed Care – PPO | Attending: Internal Medicine

## 2019-07-19 ENCOUNTER — Other Ambulatory Visit: Payer: Self-pay

## 2019-07-19 VITALS — BP 121/68 | HR 88 | Temp 99.1°F | Resp 18

## 2019-07-19 DIAGNOSIS — E538 Deficiency of other specified B group vitamins: Secondary | ICD-10-CM | POA: Diagnosis not present

## 2019-07-19 MED ORDER — CYANOCOBALAMIN 1000 MCG/ML IJ SOLN
INTRAMUSCULAR | Status: AC
  Filled 2019-07-19: qty 1

## 2019-07-19 MED ORDER — CYANOCOBALAMIN 1000 MCG/ML IJ SOLN
1000.0000 ug | Freq: Once | INTRAMUSCULAR | Status: AC
  Administered 2019-07-19: 1000 ug via INTRAMUSCULAR

## 2019-07-19 NOTE — Patient Instructions (Signed)
Cyanocobalamin, Pyridoxine, and Folate What is this medicine? A multivitamin containing folic acid, vitamin B6, and vitamin B12. This medicine may be used for other purposes; ask your health care provider or pharmacist if you have questions. COMMON BRAND NAME(S): AllanFol RX, AllanTex, Av-Vite FB, B Complex with Folic Acid, ComBgen, FaBB, Folamin, Folastin, Folbalin, Folbee, Folbic, Folcaps, Folgard, Folgard RX, Folgard RX 2.2, Folplex, Folplex 2.2, Foltabs 800, Foltx, Homocysteine Formula, Niva-Fol, NuFol, TL Gard RX, Virt-Gard, Virt-Vite, Virt-Vite Forte, Vita-Respa What should I tell my health care provider before I take this medicine? They need to know if you have any of these conditions:  bleeding or clotting disorder  history of anemia of any type  other chronic health condition  an unusual or allergic reaction to vitamins, other medicines, foods, dyes, or preservatives  pregnant or trying to get pregnant  breast-feeding How should I use this medicine? Take by mouth with a glass of water. May take with food. Follow the directions on the prescription label. It is usually given once a day. Do not take your medicine more often than directed. Contact your pediatrician regarding the use of this medicine in children. Special care may be needed. Overdosage: If you think you have taken too much of this medicine contact a poison control center or emergency room at once. NOTE: This medicine is only for you. Do not share this medicine with others. What if I miss a dose? If you miss a dose, take it as soon as you can. If it is almost time for your next dose, take only that dose. Do not take double or extra doses. What may interact with this medicine?  levodopa This list may not describe all possible interactions. Give your health care provider a list of all the medicines, herbs, non-prescription drugs, or dietary supplements you use. Also tell them if you smoke, drink alcohol, or use illegal  drugs. Some items may interact with your medicine. What should I watch for while using this medicine? See your health care professional for regular checks on your progress. Remember that vitamin supplements do not replace the need for good nutrition from a balanced diet. What side effects may I notice from receiving this medicine? Side effects that you should report to your doctor or health care professional as soon as possible:  allergic reaction such as skin rash or difficulty breathing  vomiting Side effects that usually do not require medical attention (report to your doctor or health care professional if they continue or are bothersome):  nausea  stomach upset This list may not describe all possible side effects. Call your doctor for medical advice about side effects. You may report side effects to FDA at 1-800-FDA-1088. Where should I keep my medicine? Keep out of the reach of children. Most vitamins should be stored at controlled room temperature. Check your specific product directions. Protect from heat and moisture. Throw away any unused medicine after the expiration date. NOTE: This sheet is a summary. It may not cover all possible information. If you have questions about this medicine, talk to your doctor, pharmacist, or health care provider.  2020 Elsevier/Gold Standard (2008-01-16 00:59:55)  

## 2019-07-26 ENCOUNTER — Telehealth: Payer: Self-pay

## 2019-07-26 ENCOUNTER — Other Ambulatory Visit: Payer: Self-pay | Admitting: Obstetrics and Gynecology

## 2019-07-26 DIAGNOSIS — N939 Abnormal uterine and vaginal bleeding, unspecified: Secondary | ICD-10-CM

## 2019-07-26 MED ORDER — MEGESTROL ACETATE 40 MG PO TABS: 40.0000 mg | ORAL_TABLET | Freq: Two times a day (BID) | ORAL | 3 refills | Status: DC

## 2019-07-26 NOTE — Telephone Encounter (Signed)
TC from pt regarding Rx Megace being denied Last note from 01/2019 noted for pt to continue  Rx and F/U in 1 yr.  Pt wanted to know was an appt needed to continue Rx she has noticed good results with Rx.  Please Advise.

## 2019-07-27 ENCOUNTER — Ambulatory Visit: Payer: BC Managed Care – PPO | Admitting: Gastroenterology

## 2019-07-27 NOTE — Telephone Encounter (Signed)
Pt notified and voiced understanding 

## 2019-07-29 ENCOUNTER — Telehealth: Payer: Self-pay

## 2019-07-29 ENCOUNTER — Other Ambulatory Visit: Payer: Self-pay | Admitting: Obstetrics and Gynecology

## 2019-07-29 DIAGNOSIS — N939 Abnormal uterine and vaginal bleeding, unspecified: Secondary | ICD-10-CM

## 2019-07-29 MED ORDER — MEGESTROL ACETATE 40 MG PO TABS: 40.0000 mg | ORAL_TABLET | Freq: Two times a day (BID) | ORAL | 3 refills | Status: DC

## 2019-07-29 NOTE — Telephone Encounter (Signed)
Recent Rx was refilled by you this week  pt states she was only sent 15 day supply  confirms she takes pills BID. Pt made aware  I will consult with  provider regarding additional refills. Pt wants refills sent to [facility] on [address].

## 2019-07-30 ENCOUNTER — Other Ambulatory Visit: Payer: Self-pay

## 2019-07-30 DIAGNOSIS — N939 Abnormal uterine and vaginal bleeding, unspecified: Secondary | ICD-10-CM

## 2019-07-30 MED ORDER — MEGESTROL ACETATE 40 MG PO TABS: 40.0000 mg | ORAL_TABLET | Freq: Two times a day (BID) | ORAL | 3 refills | Status: DC

## 2019-07-30 NOTE — Progress Notes (Signed)
Rx sent to wrong Pharmacy Resent

## 2019-08-02 ENCOUNTER — Encounter: Payer: Self-pay | Admitting: Internal Medicine

## 2019-08-23 ENCOUNTER — Other Ambulatory Visit: Payer: Self-pay

## 2019-08-23 ENCOUNTER — Inpatient Hospital Stay: Payer: BC Managed Care – PPO | Attending: Internal Medicine

## 2019-08-23 DIAGNOSIS — E538 Deficiency of other specified B group vitamins: Secondary | ICD-10-CM

## 2019-08-23 MED ORDER — CYANOCOBALAMIN 1000 MCG/ML IJ SOLN
INTRAMUSCULAR | Status: AC
  Filled 2019-08-23: qty 1

## 2019-08-23 MED ORDER — CYANOCOBALAMIN 1000 MCG/ML IJ SOLN
1000.0000 ug | Freq: Once | INTRAMUSCULAR | Status: AC
  Administered 2019-08-23: 1000 ug via INTRAMUSCULAR

## 2019-08-23 NOTE — Patient Instructions (Signed)

## 2019-09-20 ENCOUNTER — Inpatient Hospital Stay: Payer: BC Managed Care – PPO | Attending: Internal Medicine

## 2019-10-11 ENCOUNTER — Telehealth: Payer: Self-pay | Admitting: Oncology

## 2019-10-11 NOTE — Telephone Encounter (Signed)
Higgs transfer to [facility]. Confirmed with patient 11/4 f/u and 11/16 injection. Date/time for f/u per Lattie Haw.

## 2019-10-12 NOTE — Progress Notes (Addendum)
[place] OFFICE PROGRESS NOTE   Diagnosis: Iron deficiency, B12 deficiency  INTERVAL HISTORY:   Ms. King is a 38 year old woman with a history of deficiency anemia and B12 deficiency.  She was previously followed by Dr. Walden Field.  She is seen today to establish care with Dr. Benay Spice.  The iron deficiency was felt to be secondary to menorrhagia.  She has received Injectafer in the past.  Labs from 06/20/2019 showed hemoglobin 12.5, MCV 87.8, ferritin 1018, B12 149.  Additional IV iron was not recommended.  She received B12 1000 mcg 07/19/2019 and 08/23/2019.  She did not keep the appointment for a B12 injection on 09/20/2019.  She is currently on Megace for dysfunctional uterine bleeding.  She had a menstrual cycle August of this year because she "ran out" of Megace.  Prior to that last menstrual cycle was November 2019.  She has resumed Megace.  She is not aware of any bleeding but is blind so not sure if she ever has blood in her stool or urine.  She reports undergoing a colonoscopy 15 years ago due to abdominal pain.  She last received IV iron 04/25/2019 and 05/04/2019 at [facility]  She subsequently established care with Dr. Walden Field who is no longer with this practice.  She has taken oral iron in the past but did not tolerate this well.  She also has B12 deficiency and is receiving monthly injections here.  She missed the injection scheduled last month.  She reports blindness, age 52, due to retinitis pigmentosa.  She has obstructive sleep apnea and utilizes CPAP.  She reports multiple family members are anemic.  She is not aware of the cause.  She reports her 53-year-old son is anemic.  She lives in [place].  She is divorced.  She has 2 children, daughter age 18 and son age 69.  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  She is not allergic to any medications.  She is fatigued a significant amount of the time.  As noted above she is not aware of any  bleeding but is blind so she is not sure about blood in the urine or stool.  Appetite varies.  No fever.  She has periodic sweats.  She notes intermittent foot edema and foot pain.  She has constipation and takes MiraLAX daily.  She reports eating a normal, well-balanced diet.  She reports chronic intermittent abdominal pain with periodic regurgitation/vomiting.  She denies weight loss, some weight gain.  Objective:  Vital signs in last 24 hours:  Blood pressure (!) 153/68, pulse 99, temperature 98.7 F (37.1 C), temperature source Temporal, resp. rate 18, height 5\' 7"  (1.702 m), weight (!) 326 lb 8 oz (148.1 kg), SpO2 100 %.    HEENT: No conjunctival pallor. Lymphatics: No palpable cervical, supraclavicular or axillary lymph nodes. Resp: Distant breath sounds.  No respiratory distress. Cardio: Regular rate and rhythm. GI: Abdomen soft, nontender.  Obese.  No obvious organomegaly. Vascular: No leg edema. Neuro: Alert and oriented.    Lab Results:  Lab Results  Component Value Date   WBC 9.4 06/10/2019   HGB 12.5 06/10/2019   HCT 40.3 06/10/2019   MCV 87.8 06/10/2019   PLT 299 06/10/2019   NEUTROABS 6.6 06/10/2019    Imaging:  No results found.  Medications: I have reviewed the patient's current medications.  Assessment/Plan: 1. History of iron deficiency anemia felt to be secondary to menorrhagia 2. B12 deficiency started on monthly B12  injections 07/19/2019 3. History of menorrhagia on Megace with amenorrhea 4. 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 Obstructive sleep apnea 6. Obesity  Disposition: Ms. Wagenaar is a 38 year old woman with a history of iron deficiency anemia felt to be secondary to menorrhagia and B12 deficiency started on monthly B12 injections 07/19/2019.  She is on Megace and is now amenorrheic.  She will return to the lab today for a CBC, ferritin, B12, peripheral blood smear, urinalysis.  She will begin oral B12 1000 mcg daily, discontinue  monthly B12 injections.  We are referring her to GI due to the history of iron deficiency anemia and B12 deficiency.  In addition, she reports chronic abdominal pain.  We will follow-up on the labs today.  She will return for lab and follow-up in 3 months.  She understands to contact the office in the interim with any problems.  Patient seen with Dr. Truett Perna.  40 minutes were spent face-to-face at today's visit with the majority of that time involved in counseling/coordination of care.    Lonna Cobb ANP/GNP-BC   10/13/2019  9:09 AM  Ms. Micheli has a history of iron deficiency anemia and vitamin B12 deficiency.  Iron deficiency was felt to be related to menorrhagia.  Iron deficiency anemia has resolved.  She is now amenorrheic while taking Megace.  The vitamin B12 level is normal today.  She will begin oral vitamin B12 replacement.  We made a GI referral to consider additional diagnostic evaluation in the setting of iron deficiency, B12 deficiency, and a report of intermittent abdominal pain and regurgitation.  She will return for an office and lab visit in 3 months.  Mancel Bale, MD

## 2019-10-13 ENCOUNTER — Inpatient Hospital Stay: Payer: BC Managed Care – PPO

## 2019-10-13 ENCOUNTER — Other Ambulatory Visit: Payer: Self-pay | Admitting: Nurse Practitioner

## 2019-10-13 ENCOUNTER — Telehealth: Payer: Self-pay

## 2019-10-13 ENCOUNTER — Inpatient Hospital Stay: Payer: BC Managed Care – PPO | Attending: Internal Medicine | Admitting: Nurse Practitioner

## 2019-10-13 ENCOUNTER — Other Ambulatory Visit: Payer: Self-pay

## 2019-10-13 ENCOUNTER — Encounter: Payer: Self-pay | Admitting: Physician Assistant

## 2019-10-13 ENCOUNTER — Encounter: Payer: Self-pay | Admitting: Nurse Practitioner

## 2019-10-13 VITALS — BP 153/68 | HR 99 | Temp 98.7°F | Resp 18 | Ht 67.0 in | Wt 326.5 lb

## 2019-10-13 DIAGNOSIS — E538 Deficiency of other specified B group vitamins: Secondary | ICD-10-CM | POA: Insufficient documentation

## 2019-10-13 DIAGNOSIS — H547 Unspecified visual loss: Secondary | ICD-10-CM | POA: Insufficient documentation

## 2019-10-13 DIAGNOSIS — N920 Excessive and frequent menstruation with regular cycle: Secondary | ICD-10-CM | POA: Diagnosis not present

## 2019-10-13 DIAGNOSIS — D509 Iron deficiency anemia, unspecified: Secondary | ICD-10-CM | POA: Insufficient documentation

## 2019-10-13 DIAGNOSIS — D508 Other iron deficiency anemias: Secondary | ICD-10-CM

## 2019-10-13 DIAGNOSIS — H3552 Pigmentary retinal dystrophy: Secondary | ICD-10-CM | POA: Insufficient documentation

## 2019-10-13 LAB — CBC WITH DIFFERENTIAL (CANCER CENTER ONLY)
Abs Immature Granulocytes: 0.02 10*3/uL (ref 0.00–0.07)
Basophils Absolute: 0 10*3/uL (ref 0.0–0.1)
Basophils Relative: 0 %
Eosinophils Absolute: 0.1 10*3/uL (ref 0.0–0.5)
Eosinophils Relative: 2 %
HCT: 37.2 % (ref 36.0–46.0)
Hemoglobin: 12 g/dL (ref 12.0–15.0)
Immature Granulocytes: 0 %
Lymphocytes Relative: 25 %
Lymphs Abs: 2.2 10*3/uL (ref 0.7–4.0)
MCH: 27.9 pg (ref 26.0–34.0)
MCHC: 32.3 g/dL (ref 30.0–36.0)
MCV: 86.5 fL (ref 80.0–100.0)
Monocytes Absolute: 0.5 10*3/uL (ref 0.1–1.0)
Monocytes Relative: 6 %
Neutro Abs: 5.9 10*3/uL (ref 1.7–7.7)
Neutrophils Relative %: 67 %
Platelet Count: 340 10*3/uL (ref 150–400)
RBC: 4.3 MIL/uL (ref 3.87–5.11)
RDW: 13 % (ref 11.5–15.5)
WBC Count: 8.8 10*3/uL (ref 4.0–10.5)
nRBC: 0 % (ref 0.0–0.2)

## 2019-10-13 LAB — URINALYSIS, COMPLETE (UACMP) WITH MICROSCOPIC
Bilirubin Urine: NEGATIVE
Glucose, UA: NEGATIVE mg/dL
Hgb urine dipstick: NEGATIVE
Ketones, ur: NEGATIVE mg/dL
Leukocytes,Ua: NEGATIVE
Nitrite: NEGATIVE
Protein, ur: NEGATIVE mg/dL
Specific Gravity, Urine: 1.018 (ref 1.005–1.030)
pH: 6 (ref 5.0–8.0)

## 2019-10-13 LAB — VITAMIN B12: Vitamin B-12: 454 pg/mL (ref 180–914)

## 2019-10-13 LAB — SAVE SMEAR(SSMR), FOR PROVIDER SLIDE REVIEW

## 2019-10-13 LAB — FERRITIN: Ferritin: 670 ng/mL — ABNORMAL HIGH (ref 11–307)

## 2019-10-13 NOTE — Telephone Encounter (Signed)
-----   Message from Owens Shark, NP sent at 10/13/2019  2:38 PM EST ----- Please add a urine culture to the urinalysis from today.  I have entered the order for the culture.  Please let her know hemoglobin remains in normal range.  There is no need for IV iron at this time.  B12 is now in normal range.  Begin oral B12 as we discussed at today's visit.  Follow-up as scheduled.

## 2019-10-13 NOTE — Telephone Encounter (Signed)
Spoke with patient informing of lab result info and no need for IV iron.  Per NP, please start B12 as discussed at today's visit and F/U as scheduled

## 2019-10-15 ENCOUNTER — Telehealth: Payer: Self-pay | Admitting: *Deleted

## 2019-10-15 LAB — URINE CULTURE: Culture: >=100000 — AB

## 2019-10-15 MED ORDER — CIPROFLOXACIN HCL 500 MG PO TABS: 500.0000 mg | ORAL_TABLET | Freq: Two times a day (BID) | ORAL | 0 refills | Status: DC

## 2019-10-15 NOTE — Telephone Encounter (Signed)
-----   Message from Owens Shark, NP sent at 10/15/2019 12:45 PM EST ----- Please forward the urinalysis and culture results to her PCP.  Please let Ms. Mousseau know there was no blood in her urine but she does have a urinary tract infection and we are sending the results to her PCP. Thanks

## 2019-10-15 NOTE — Telephone Encounter (Signed)
Notified of U/A and culture results and that we are sending these results to her PCP office, which she confirmed as Junie Spencer, PA. Faxed labs to [phone] with not that she has UTI and we did not treat it.

## 2019-10-15 NOTE — Telephone Encounter (Signed)
Attempted to fax labs to PCP office X 5 without success. Per Dr. Benay Spice: Will order Cipro 500mg  bid X 5 day. Patient notified. Also instructed to push fluids.

## 2019-10-22 ENCOUNTER — Encounter: Payer: Self-pay | Admitting: Physician Assistant

## 2019-10-22 ENCOUNTER — Ambulatory Visit (INDEPENDENT_AMBULATORY_CARE_PROVIDER_SITE_OTHER): Payer: BC Managed Care – PPO | Admitting: Physician Assistant

## 2019-10-22 VITALS — BP 129/80 | HR 114 | Temp 98.4°F | Ht 67.0 in | Wt 326.0 lb

## 2019-10-22 DIAGNOSIS — D508 Other iron deficiency anemias: Secondary | ICD-10-CM

## 2019-10-22 DIAGNOSIS — R1013 Epigastric pain: Secondary | ICD-10-CM | POA: Diagnosis not present

## 2019-10-22 DIAGNOSIS — R12 Heartburn: Secondary | ICD-10-CM | POA: Diagnosis not present

## 2019-10-22 DIAGNOSIS — R112 Nausea with vomiting, unspecified: Secondary | ICD-10-CM | POA: Diagnosis not present

## 2019-10-22 NOTE — Progress Notes (Signed)
Chief Complaint: Anemia, nausea and vomiting  HPI:    Alexandra Rose is a 38 year old African-American female with a past medical history as listed below including iron deficiency and B12 deficiency and legal blindness, who presents to clinic today with a complaint of anemia and history of recent nausea and vomiting.    10/13/2019 patient seen by hematology oncology for IDA and B12 deficiency.  At that time was discussed that she had dysfunctional uterine bleeding was thought to be the cause of her anemia.  At that time they recommended a GI consultation for her iron deficiency anemia.    Today, patient tells me that she has been iron deficient for a long time and actually reports of previous EGD and colonoscopy about 15 years ago when she lived in [place].  She does not know where but tells me this was normal.  She has had no further evaluation since then and has continued to be iron deficient as well as B12 deficient.  Tells me that her new hematologist wants her to have repeat procedures for further evaluation.    Also describes today that she becomes nauseous and sometimes vomits when she smells "weird smells", or eats too much oily foods or tomatoes.  When she starts vomiting this will last for 15 to 20 minutes and then go away until the next episode.  This happens maybe once a week or so.  Also some associated epigastric pain and heartburn and reflux, though this is infrequent per the patient.    Lives in an apartment with her visually impaired friend, has no family in town.    Denies fever, chills, weight loss or anorexia.  Past Medical History:  Diagnosis Date  . B12 deficiency 06/24/2019  . Blind   . Hypertension     Past Surgical History:  Procedure Laterality Date  . CESAREAN SECTION    . CESAREAN SECTION    . TONSILLECTOMY    . TUBAL LIGATION  2015    Current Outpatient Medications  Medication Sig Dispense Refill  . Ascorbic Acid (VITAMIN C) 100 MG tablet Vitamin C    .  cetirizine (ZYRTEC) 10 MG tablet Take by mouth.    . hydrochlorothiazide (MICROZIDE) 12.5 MG capsule Take 12.5 mg by mouth daily.    . megestrol (MEGACE) 40 MG tablet Take 1 tablet (40 mg total) by mouth 2 (two) times daily. 60 tablet 3  . polyethylene glycol (MIRALAX / GLYCOLAX) 17 g packet TK 17 G PO D FOR 3 DAYS    . vitamin B-12 (CYANOCOBALAMIN) 1000 MCG tablet Take 1,000 mcg by mouth daily.     No current facility-administered medications for this visit.     Allergies as of 10/22/2019 - Review Complete 10/22/2019  Allergen Reaction Noted  . Tomato Nausea And Vomiting 05/01/2018  . Shellfish allergy  11/30/2018    Family History  Problem Relation Age of Onset  . Hypertension Father   . Diabetes Sister     Social History   Socioeconomic History  . Marital status: Significant Other    Spouse name: Not on file  . Number of children: Not on file  . Years of education: Not on file  . Highest education level: Not on file  Occupational History  . Not on file  Social Needs  . Financial resource strain: Not on file  . Food insecurity    Worry: Not on file    Inability: Not on file  . Transportation needs    Medical: Not  on file    Non-medical: Not on file  Tobacco Use  . Smoking status: Current Some Day Smoker  . Smokeless tobacco: Never Used  . Tobacco comment: 'i finish a pack in about a week or two'  Substance and Sexual Activity  . Alcohol use: Yes    Comment: 'a few glasses of wine per week'  . Drug use: Not Currently  . Sexual activity: Yes  Lifestyle  . Physical activity    Days per week: Not on file    Minutes per session: Not on file  . Stress: Not on file  Relationships  . Social Herbalist on phone: Not on file    Gets together: Not on file    Attends religious service: Not on file    Active member of club or organization: Not on file    Attends meetings of clubs or organizations: Not on file    Relationship status: Not on file  . Intimate  partner violence    Fear of current or ex partner: Not on file    Emotionally abused: Not on file    Physically abused: Not on file    Forced sexual activity: Not on file  Other Topics Concern  . Not on file  Social History Narrative   Lives with boyfriend at this time   Employed    Review of Systems:    Constitutional: No weight loss, fever or chills Skin: No rash  Cardiovascular: No chest pain Respiratory: No SOB  Gastrointestinal: See HPI and otherwise negative Genitourinary: No dysuria  Neurological: No headache Musculoskeletal: No new muscle or joint pain Hematologic: No bleeding or bruising Psychiatric: No history of depression or anxiety   Physical Exam:  Vital signs: BP 129/80 (BP Location: Left Wrist, Patient Position: Sitting)   Pulse (!) 114   Temp 98.4 F (36.9 C)   Ht 5\' 7"  (1.702 m)   Wt (!) 326 lb (147.9 kg)   SpO2 98%   BMI 51.06 kg/m   Constitutional:   Pleasant obese AA female appears to be in NAD, Well developed, Well nourished, alert and cooperative Head:  Normocephalic and atraumatic. Eyes:   Legally blind Ears:  Normal auditory acuity. Neck:  Supple Throat: Oral cavity and pharynx without inflammation, swelling or lesion.  Respiratory: Respirations even and unlabored. Lungs clear to auscultation bilaterally.   No wheezes, crackles, or rhonchi.  Cardiovascular: Normal S1, S2. No MRG. Regular rate and rhythm. No peripheral edema, cyanosis or pallor.  Gastrointestinal:  Soft, nondistended, nontender. No rebound or guarding. Normal bowel sounds. No appreciable masses or hepatomegaly. Rectal:  Not performed.  Msk:  Symmetrical without gross deformities. Without edema, no deformity or joint abnormality.  Neurologic:  Alert and  oriented x4;  grossly normal neurologically.  Skin:   Dry and intact without significant lesions or rashes. Psychiatric: Demonstrates good judgement and reason without abnormal affect or behaviors.  MOST RECENT LABS AND  IMAGING: CBC    Component Value Date/Time   WBC 8.8 10/13/2019 1012   RBC 4.30 10/13/2019 1012   HGB 12.0 10/13/2019 1012   HGB 11.7 02/01/2019 1439   HCT 37.2 10/13/2019 1012   HCT 37.2 02/01/2019 1439   PLT 340 10/13/2019 1012   PLT 369 02/01/2019 1439   MCV 86.5 10/13/2019 1012   MCV 80 02/01/2019 1439   MCH 27.9 10/13/2019 1012   MCHC 32.3 10/13/2019 1012   RDW 13.0 10/13/2019 1012   RDW 22.2 (H) 02/01/2019 1439  LYMPHSABS 2.2 10/13/2019 1012   MONOABS 0.5 10/13/2019 1012   EOSABS 0.1 10/13/2019 1012   BASOSABS 0.0 10/13/2019 1012    CMP     Component Value Date/Time   NA 141 06/10/2019 1354   NA 144 02/01/2019 1439   K 3.6 06/10/2019 1354   CL 108 06/10/2019 1354   CO2 21 (L) 06/10/2019 1354   GLUCOSE 104 (H) 06/10/2019 1354   BUN 9 06/10/2019 1354   BUN 10 02/01/2019 1439   CREATININE 0.92 06/10/2019 1354   CALCIUM 8.7 (L) 06/10/2019 1354   PROT 8.5 (H) 06/10/2019 1354   PROT 7.8 02/01/2019 1439   ALBUMIN 3.7 06/10/2019 1354   ALBUMIN 4.4 02/01/2019 1439   AST 18 06/10/2019 1354   ALT 27 06/10/2019 1354   ALKPHOS 103 06/10/2019 1354   BILITOT 0.3 06/10/2019 1354   GFRNONAA >60 06/10/2019 1354   GFRAA >60 06/10/2019 1354    Assessment: 1.  Anemia: Chronic for the patient, reports EGD and colon 15 years ago in [place], unsure where, unable to get records, follows with hematology now and remains on iron and B12 supplements 2.  Nausea and vomiting: Only with certain smells or foods; consideration gastritis versus gallbladder etiology versus other 3.  Epigastric pain 4.  Heartburn: Infrequent per the patient, though could be contributing to nausea vomiting epigastric pain  Plan: 1.  Patient will be scheduled for an EGD and colonoscopy for her iron deficiency anemia in the hospital with Dr. Meridee Score as he is supervising today.  She is in the hospital due to her BMI.  Did discuss risks, benefits, limitations and alternatives and patient agrees to proceed.   Patient was offered a December appointment today but tells me that she cannot come that date.  She is placed on the recall list for January.  I do not see this being an issue as this anemia has been chronic for her and there are no signs of acute GI bleed. 2.  Discussed medication for heartburn but the patient tells me is very infrequent.  Discussed antiemetics but patient tells me that her nausea is unpredictable. 3.  Patient will require transport service to the hospital.  Provided her with information regarding this. 4.  Patient to return to clinic per recommendations from Dr. Meridee Score after procedures.  Hyacinth Meeker, PA-C [facility] 10/22/2019, 4:07 PM  Cc: [facility]*

## 2019-10-22 NOTE — Patient Instructions (Signed)
It has been recommended to you by your physician that you have a(n) EGD and colonoscopy completed at [facility]. We will contact you when we get the January calendar dates to set this up.    I appreciate the opportunity to care for you. Ellouise Newer, PA-C

## 2019-10-25 ENCOUNTER — Ambulatory Visit: Payer: BC Managed Care – PPO

## 2019-10-25 NOTE — Progress Notes (Signed)
Attending Physician's Attestation   I have reviewed the chart.   I agree with the Advanced Practitioner's note, impression, and recommendations with any updates as below.  Due to patient's legally blind status she will have to have consent performed in the hospital.  Justice Britain, MD [facility] Advanced Endoscopy Office # [phone]

## 2019-11-22 ENCOUNTER — Telehealth: Payer: Self-pay

## 2019-11-22 DIAGNOSIS — R1013 Epigastric pain: Secondary | ICD-10-CM

## 2019-11-22 DIAGNOSIS — D508 Other iron deficiency anemias: Secondary | ICD-10-CM

## 2019-11-22 DIAGNOSIS — R112 Nausea with vomiting, unspecified: Secondary | ICD-10-CM

## 2019-11-22 DIAGNOSIS — R12 Heartburn: Secondary | ICD-10-CM

## 2019-11-22 NOTE — Telephone Encounter (Signed)
I called and spoke with Alexandra Rose and told her we have not forgotten her we are trying to get a date to do her hospital ECL. I will try to call her back tomorrow after consulting with the CMA about a date. Looks like January 18th is already full for Dr Manfred Shirts.

## 2019-11-23 NOTE — Telephone Encounter (Signed)
I have spoken to [facility] and I will try to get this case set up tomorrow. We went over the general instructions and she request I email them to her when everything is finalized.

## 2019-11-24 ENCOUNTER — Other Ambulatory Visit: Payer: Self-pay | Admitting: Gastroenterology

## 2019-11-24 DIAGNOSIS — D508 Other iron deficiency anemias: Secondary | ICD-10-CM

## 2019-11-24 DIAGNOSIS — R112 Nausea with vomiting, unspecified: Secondary | ICD-10-CM

## 2019-11-24 DIAGNOSIS — R12 Heartburn: Secondary | ICD-10-CM

## 2019-11-24 DIAGNOSIS — R1013 Epigastric pain: Secondary | ICD-10-CM

## 2019-11-24 MED ORDER — METOCLOPRAMIDE HCL 10 MG PO TABS: ORAL_TABLET | ORAL | 0 refills | Status: DC

## 2019-11-24 MED ORDER — NA SULFATE-K SULFATE-MG SULF 17.5-3.13-1.6 GM/177ML PO SOLN: 1.0000 | Freq: Once | ORAL | 0 refills | Status: AC

## 2019-11-24 NOTE — Telephone Encounter (Signed)
Pt is asking to speak with PJ about her appt. "I would rather speak with PJ"

## 2019-11-24 NOTE — Telephone Encounter (Signed)
I spoke with Alexandra Rose and she has worked it out to [employer] so I will call and set things up.

## 2019-11-24 NOTE — Telephone Encounter (Signed)
I have set Alexandra Rose up for 12/30/2019 at 10:30, arrive at 9:00AM at [facility] for her [facility] with Dr Rush Landmark. To covid screen 12/27/2019 at 3:20pm at the [facility]. I have emailed the instructions to her per her request. I sent in her reglan to help with nausea and also her prep kit. Confirmed pharmacy. I have called Larey Brick at [phone] ([facility]) and left a message to see about lining [facility] up to take her to these appointments. [name] gets off work at 4 so I will call her later to make sure she got the instructions.

## 2019-11-25 NOTE — Telephone Encounter (Signed)
I spoke with her late yesterday and we briefly went over the directions and I told her to check her email and we will answer any questions she has.

## 2019-11-25 NOTE — Telephone Encounter (Signed)
Pt said she cannot access her email to get the information.  Requesting a call back

## 2019-11-25 NOTE — Telephone Encounter (Signed)
I spoke with Alexandra Rose and we went over her directions in more detail. She wants me to call her back when the date gets closer to remind her of dates and times of her upcoming procedure. She is going to try to get a division of [facility] called [employer] to help her out. She informed me that she found out that Alexandra Rose is not longer with [facility], now they go thru [place]. Alexandra Rose said the instructions came thru her email however her phone is unable to read them it just says "image, image".

## 2019-12-20 NOTE — Telephone Encounter (Signed)
I went over the instructions with her and questions answered.

## 2019-12-20 NOTE — Telephone Encounter (Signed)
Touched base today and she request I call her back late today to go over instructions.

## 2019-12-27 ENCOUNTER — Inpatient Hospital Stay (HOSPITAL_COMMUNITY): Admission: RE | Admit: 2019-12-27 | Payer: BC Managed Care – PPO | Source: Ambulatory Visit

## 2019-12-28 ENCOUNTER — Other Ambulatory Visit (HOSPITAL_COMMUNITY)
Admission: RE | Admit: 2019-12-28 | Discharge: 2019-12-28 | Disposition: A | Discharge status: Home / Self Care | Payer: BC Managed Care – PPO | Source: Ambulatory Visit | Attending: Gastroenterology | Admitting: Gastroenterology

## 2019-12-28 DIAGNOSIS — Z20822 Contact with and (suspected) exposure to covid-19: Secondary | ICD-10-CM | POA: Diagnosis not present

## 2019-12-28 DIAGNOSIS — Z01812 Encounter for preprocedural laboratory examination: Secondary | ICD-10-CM | POA: Diagnosis not present

## 2019-12-28 LAB — SARS CORONAVIRUS 2 (TAT 6-24 HRS): SARS Coronavirus 2: NEGATIVE

## 2019-12-29 ENCOUNTER — Other Ambulatory Visit: Payer: Self-pay

## 2019-12-29 ENCOUNTER — Encounter (HOSPITAL_COMMUNITY): Payer: Self-pay | Admitting: Gastroenterology

## 2019-12-29 NOTE — Anesthesia Preprocedure Evaluation (Addendum)
Anesthesia Evaluation  Patient identified by MRN, date of birth, ID band Patient awake    Reviewed: Allergy & Precautions, H&P , NPO status , Patient's Chart, lab work & pertinent test results  Airway Mallampati: III  TM Distance: >3 FB Neck ROM: Full    Dental no notable dental hx. (+) Teeth Intact, Dental Advisory Given   Pulmonary sleep apnea and Continuous Positive Airway Pressure Ventilation , Current Smoker and Patient abstained from smoking.,    Pulmonary exam normal breath sounds clear to auscultation       Cardiovascular Exercise Tolerance: Good hypertension, Pt. on medications  Rhythm:Regular Rate:Normal     Neuro/Psych negative neurological ROS  negative psych ROS   GI/Hepatic Neg liver ROS, GERD  ,  Endo/Other  Morbid obesity  Renal/GU negative Renal ROS  negative genitourinary   Musculoskeletal   Abdominal   Peds  Hematology  (+) Blood dyscrasia, anemia ,   Anesthesia Other Findings   Reproductive/Obstetrics negative OB ROS                            Anesthesia Physical Anesthesia Plan  ASA: III  Anesthesia Plan: MAC   Post-op Pain Management:    Induction: Intravenous  PONV Risk Score and Plan: 1 and Propofol infusion  Airway Management Planned: Nasal Cannula  Additional Equipment:   Intra-op Plan:   Post-operative Plan:   Informed Consent: I have reviewed the patients History and Physical, chart, labs and discussed the procedure including the risks, benefits and alternatives for the proposed anesthesia with the patient or authorized representative who has indicated his/her understanding and acceptance.     Dental advisory given  Plan Discussed with: CRNA  Anesthesia Plan Comments:         Anesthesia Quick Evaluation

## 2019-12-29 NOTE — Progress Notes (Signed)
Pt denies SOB and chest pain. Pt stated that she is under the care of Dr. Chales Abrahams, Cardiology              [facility]). Nurse requested echo and EKG tracing from cardiologist; awaiting response ( check Onbase DOS). Pt made aware to stop taking vitamins, fish oil and herbal medications. Do not take any NSAIDs ie: Ibuprofen, Advil, Naproxen (Aleve), Motrin, BC and Goody Powder. Pt reminded to quarantine. Pt verbalized understanding of all pre-op instructions.

## 2019-12-29 NOTE — Progress Notes (Signed)
Attempted pre op call for endo procedure tomorrow, did not answer,automated voicemail- no message.

## 2019-12-29 NOTE — Progress Notes (Signed)
Patient returned pre call, pre op call done. Patient to arrive by 0630

## 2019-12-30 ENCOUNTER — Encounter (HOSPITAL_COMMUNITY): Payer: Self-pay | Admitting: Gastroenterology

## 2019-12-30 ENCOUNTER — Ambulatory Visit (HOSPITAL_COMMUNITY)
Admission: RE | Admit: 2019-12-30 | Discharge: 2019-12-30 | Disposition: A | Discharge status: Home / Self Care | Payer: BC Managed Care – PPO | Attending: Gastroenterology | Admitting: Gastroenterology

## 2019-12-30 ENCOUNTER — Encounter (HOSPITAL_COMMUNITY)
Admission: RE | Disposition: A | Discharge status: Home / Self Care | Payer: Self-pay | Source: Home / Self Care | Attending: Gastroenterology

## 2019-12-30 ENCOUNTER — Ambulatory Visit (HOSPITAL_COMMUNITY): Payer: BC Managed Care – PPO | Admitting: Anesthesiology

## 2019-12-30 ENCOUNTER — Other Ambulatory Visit: Payer: Self-pay

## 2019-12-30 DIAGNOSIS — K219 Gastro-esophageal reflux disease without esophagitis: Secondary | ICD-10-CM | POA: Diagnosis not present

## 2019-12-30 DIAGNOSIS — K2950 Unspecified chronic gastritis without bleeding: Secondary | ICD-10-CM | POA: Diagnosis not present

## 2019-12-30 DIAGNOSIS — D509 Iron deficiency anemia, unspecified: Secondary | ICD-10-CM

## 2019-12-30 DIAGNOSIS — G4730 Sleep apnea, unspecified: Secondary | ICD-10-CM | POA: Diagnosis not present

## 2019-12-30 DIAGNOSIS — R112 Nausea with vomiting, unspecified: Secondary | ICD-10-CM

## 2019-12-30 DIAGNOSIS — Z6841 Body Mass Index (BMI) 40.0 and over, adult: Secondary | ICD-10-CM | POA: Diagnosis not present

## 2019-12-30 DIAGNOSIS — F17210 Nicotine dependence, cigarettes, uncomplicated: Secondary | ICD-10-CM | POA: Insufficient documentation

## 2019-12-30 DIAGNOSIS — R1013 Epigastric pain: Secondary | ICD-10-CM

## 2019-12-30 DIAGNOSIS — Z91013 Allergy to seafood: Secondary | ICD-10-CM | POA: Insufficient documentation

## 2019-12-30 DIAGNOSIS — K3189 Other diseases of stomach and duodenum: Secondary | ICD-10-CM | POA: Diagnosis not present

## 2019-12-30 DIAGNOSIS — I10 Essential (primary) hypertension: Secondary | ICD-10-CM | POA: Insufficient documentation

## 2019-12-30 DIAGNOSIS — K641 Second degree hemorrhoids: Secondary | ICD-10-CM | POA: Insufficient documentation

## 2019-12-30 DIAGNOSIS — K449 Diaphragmatic hernia without obstruction or gangrene: Secondary | ICD-10-CM | POA: Insufficient documentation

## 2019-12-30 DIAGNOSIS — K228 Other specified diseases of esophagus: Secondary | ICD-10-CM | POA: Diagnosis not present

## 2019-12-30 DIAGNOSIS — Z91018 Allergy to other foods: Secondary | ICD-10-CM | POA: Diagnosis not present

## 2019-12-30 DIAGNOSIS — R12 Heartburn: Secondary | ICD-10-CM

## 2019-12-30 DIAGNOSIS — D508 Other iron deficiency anemias: Secondary | ICD-10-CM

## 2019-12-30 HISTORY — DX: Morbid (severe) obesity due to excess calories: E66.01

## 2019-12-30 HISTORY — DX: Sleep apnea, unspecified: G47.30

## 2019-12-30 HISTORY — PX: COLONOSCOPY WITH PROPOFOL: SHX5780

## 2019-12-30 HISTORY — DX: Dyspnea, unspecified: R06.00

## 2019-12-30 HISTORY — DX: Allergy, unspecified, initial encounter: T78.40XA

## 2019-12-30 HISTORY — PX: BIOPSY: SHX5522

## 2019-12-30 HISTORY — PX: ESOPHAGOGASTRODUODENOSCOPY (EGD) WITH PROPOFOL: SHX5813

## 2019-12-30 HISTORY — DX: Anemia, unspecified: D64.9

## 2019-12-30 HISTORY — DX: Gastro-esophageal reflux disease without esophagitis: K21.9

## 2019-12-30 SURGERY — ESOPHAGOGASTRODUODENOSCOPY (EGD) WITH PROPOFOL
Anesthesia: Monitor Anesthesia Care

## 2019-12-30 MED ORDER — PROPOFOL 10 MG/ML IV BOLUS
INTRAVENOUS | Status: DC | PRN
  Administered 2019-12-30 (×2): 20 mg via INTRAVENOUS

## 2019-12-30 MED ORDER — SODIUM CHLORIDE 0.9 % IV SOLN: INTRAVENOUS | Status: DC

## 2019-12-30 MED ORDER — LIDOCAINE 2% (20 MG/ML) 5 ML SYRINGE
INTRAMUSCULAR | Status: DC | PRN
  Administered 2019-12-30: 60 mg via INTRAVENOUS

## 2019-12-30 MED ORDER — PROPOFOL 500 MG/50ML IV EMUL
INTRAVENOUS | Status: DC | PRN
  Administered 2019-12-30: 100 ug/kg/min via INTRAVENOUS

## 2019-12-30 MED ORDER — LACTATED RINGERS IV SOLN: INTRAVENOUS | Status: DC

## 2019-12-30 SURGICAL SUPPLY — 24 items

## 2019-12-30 NOTE — Discharge Instructions (Signed)
YOU HAD AN ENDOSCOPIC PROCEDURE TODAY: Refer to the procedure report and other information in the discharge instructions given to you for any specific questions about what was found during the examination. If this information does not answer your questions, please call [place] office at [phone] to clarify.  ° °YOU SHOULD EXPECT: Some feelings of bloating in the abdomen. Passage of more gas than usual. Walking can help get rid of the air that was put into your GI tract during the procedure and reduce the bloating. If you had a lower endoscopy (such as a colonoscopy or flexible sigmoidoscopy) you may notice spotting of blood in your stool or on the toilet paper. Some abdominal soreness may be present for a day or two, also. ° °DIET: Your first meal following the procedure should be a light meal and then it is ok to progress to your normal diet. A half-sandwich or bowl of soup is an example of a good first meal. Heavy or fried foods are harder to digest and may make you feel nauseous or bloated. Drink plenty of fluids but you should avoid alcoholic beverages for 24 hours. If you had a esophageal dilation, please see attached instructions for diet.   ° °ACTIVITY: Your care partner should take you home directly after the procedure. You should plan to take it easy, moving slowly for the rest of the day. You can resume normal activity the day after the procedure however YOU SHOULD NOT DRIVE, use power tools, machinery or perform tasks that involve climbing or major physical exertion for 24 hours (because of the sedation medicines used during the test).  ° °SYMPTOMS TO REPORT IMMEDIATELY: °A gastroenterologist can be reached at any hour. Please call [phone]  for any of the following symptoms:  °Following lower endoscopy (colonoscopy, flexible sigmoidoscopy) °Excessive amounts of blood in the stool  °Significant tenderness, worsening of abdominal pains  °Swelling of the abdomen that is new, acute  °Fever of 100° or  higher  °Following upper endoscopy (EGD, EUS, ERCP, esophageal dilation) °Vomiting of blood or coffee ground material  °New, significant abdominal pain  °New, significant chest pain or pain under the shoulder blades  °Painful or persistently difficult swallowing  °New shortness of breath  °Black, tarry-looking or red, bloody stools ° °FOLLOW UP:  °If any biopsies were taken you will be contacted by phone or by letter within the next 1-3 weeks. Call [phone]  if you have not heard about the biopsies in 3 weeks.  °Please also call with any specific questions about appointments or follow up tests. ° °

## 2019-12-30 NOTE — Op Note (Signed)
[facility] Patient Name: Alexandra Rose Procedure Date : 12/30/2019 MRN: [id number] Attending MD: Corliss Parish , MD Date of Birth: [birth year] CSN: [id number] Age: 39 Admit Type: Outpatient Procedure:                Upper GI endoscopy Indications:              Iron deficiency anemia, Heartburn Providers:                Corliss Parish, MD, Zoe Lan, RN, Arlee Muslim Tech., Technician, Jefm Miles, CRNA Referring MD:             Unk Lightning, [facility] Medicines:                Monitored Anesthesia Care Complications:            No immediate complications. Estimated Blood Loss:     Estimated blood loss was minimal. Procedure:                Pre-Anesthesia Assessment:                           - Prior to the procedure, a History and Physical                            was performed, and patient medications and                            allergies were reviewed. The patient's tolerance of                            previous anesthesia was also reviewed. The risks                            and benefits of the procedure and the sedation                            options and risks were discussed with the patient.                            All questions were answered, and informed consent                            was obtained. Prior Anticoagulants: The patient has                            taken no previous anticoagulant or antiplatelet                            agents. ASA Grade Assessment: III - A patient with  severe systemic disease. After reviewing the risks                            and benefits, the patient was deemed in                            satisfactory condition to undergo the procedure.                           After obtaining informed consent, the endoscope was                            passed under direct vision.  Throughout the                            procedure, the patient's blood pressure, pulse, and                            oxygen saturations were monitored continuously. The                            [id number]-H190 ([id number] Olympus gastroscope was                            introduced through the mouth, and advanced to the                            second part of duodenum. The upper GI endoscopy was                            accomplished without difficulty. The patient                            tolerated the procedure. Scope In: Scope Out: Findings:      No gross lesions were noted in the entire esophagus. Biopsies were taken       with a cold forceps for histology for EoE/LoE rule out.      The Z-line was irregular and was found 35 cm from the incisors.      A small sliding hiatal hernia was present.      Patchy mildly erythematous mucosa without bleeding was found in the       gastric body and in the gastric antrum.      No gross lesions were noted in the entire examined stomach. Biopsies       were taken with a cold forceps for histology and Helicobacter pylori       testing.      No gross lesions were noted in the duodenal bulb, in the first portion       of the duodenum and in the second portion of the duodenum. Biopsies for       histology were taken with a cold forceps for evaluation of celiac       disease. Impression:               - No gross lesions in esophagus. Biopsied. Z-line  irregular, 35 cm from the incisors.                           - Small sliding hiatal hernia.                           - Erythematous mucosa in the gastric body and                            antrum. No other gross lesions in the stomach.                            Biopsied.                           - No gross lesions in the duodenal bulb, in the                            first portion of the duodenum and in the second                            portion of the duodenum.  Biopsied. Recommendation:           - Proceed to scheduled colonoscopy.                           - Observe patient's clinical course.                           - Await pathology results.                           - Based on biopsies would consider PPI use in                            future.                           - If issues of N/V persist then ensure TFTs and                            Cortisol have been checked.                           - The findings and recommendations were discussed                            with the patient.                           - The findings and recommendations were discussed                            with the designated responsible adult. Procedure Code(s):        --- Professional ---  71245, Esophagogastroduodenoscopy, flexible,                            transoral; with biopsy, single or multiple Diagnosis Code(s):        --- Professional ---                           K22.8, Other specified diseases of esophagus                           K44.9, Diaphragmatic hernia without obstruction or                            gangrene                           K31.89, Other diseases of stomach and duodenum                           D50.9, Iron deficiency anemia, unspecified                           R12, Heartburn CPT copyright 2019 American Medical Association. All rights reserved. The codes documented in this report are preliminary and upon coder review may  be revised to meet current compliance requirements. Corliss Parish, MD 12/30/2019 8:34:59 AM Number of Addenda: 0

## 2019-12-30 NOTE — Anesthesia Postprocedure Evaluation (Signed)
Anesthesia Post Note  Patient: Engineer, site  Procedure(s) Performed: ESOPHAGOGASTRODUODENOSCOPY (EGD) WITH PROPOFOL (N/A ) COLONOSCOPY WITH PROPOFOL (N/A ) BIOPSY     Patient location during evaluation: PACU Anesthesia Type: MAC Level of consciousness: awake and alert Pain management: pain level controlled Vital Signs Assessment: post-procedure vital signs reviewed and stable Respiratory status: spontaneous breathing, nonlabored ventilation and respiratory function stable Cardiovascular status: stable and blood pressure returned to baseline Postop Assessment: no apparent nausea or vomiting Anesthetic complications: no    Last Vitals:  Vitals:   12/30/19 0905 12/30/19 0913  BP: (!) 113/59 115/60  Pulse: 73 77  Resp: (!) 24 (!) 21  Temp: (!) 36.3 C (!) 36.3 C  SpO2: 100% 100%    Last Pain:  Vitals:   12/30/19 0913  TempSrc:   PainSc: 0-No pain                 Daeron Carreno,W. EDMOND

## 2019-12-30 NOTE — H&P (Signed)
GASTROENTEROLOGY PROCEDURE H&P NOTE   Primary Care Physician: [facility]  HPI: Alexandra Rose is a 39 y.o. female who presents for EGD/Colonoscopy.  Past Medical History:  Diagnosis Date  . Allergies   . Anemia   . B12 deficiency 06/24/2019  . Blind   . Dyspnea   . GERD (gastroesophageal reflux disease)   . Hypertension   . Morbid obesity with BMI of 50.0-59.9, adult ([place])   . Sleep apnea    wears CPAP    Past Surgical History:  Procedure Laterality Date  . CESAREAN SECTION    . CESAREAN SECTION    . TONSILLECTOMY    . TUBAL LIGATION  2015   Current Facility-Administered Medications  Medication Dose Route Frequency Provider Last Rate Last Admin  . lactated ringers infusion   Intravenous Continuous Mansouraty, Telford Nab., MD 20 mL/hr at 12/30/19 0711 New Bag at 12/30/19 1610   Allergies  Allergen Reactions  . Tomato Nausea And Vomiting  . Shellfish Allergy    Family History  Problem Relation Age of Onset  . Hypertension Father   . Diabetes Sister   . Hypertension Mother    Social History   Socioeconomic History  . Marital status: Significant Other    Spouse name: Not on file  . Number of children: Not on file  . Years of education: Not on file  . Highest education level: Not on file  Occupational History  . Not on file  Tobacco Use  . Smoking status: Current Some Day Smoker  . Smokeless tobacco: Never Used  . Tobacco comment: 'i finish a pack in about a week or two'  Substance and Sexual Activity  . Alcohol use: Yes    Comment: 'a few glasses of wine per week'  . Drug use: Not Currently  . Sexual activity: Yes  Other Topics Concern  . Not on file  Social History Narrative   Lives with boyfriend at this time   Employed   Social Determinants of Health   Financial Resource Strain:   . Difficulty of Paying Living Expenses: Not on file  Food Insecurity:   . Worried About Charity fundraiser in the Last Year: Not  on file  . Ran Out of Food in the Last Year: Not on file  Transportation Needs:   . Lack of Transportation (Medical): Not on file  . Lack of Transportation (Non-Medical): Not on file  Physical Activity:   . Days of Exercise per Week: Not on file  . Minutes of Exercise per Session: Not on file  Stress:   . Feeling of Stress : Not on file  Social Connections:   . Frequency of Communication with Friends and Family: Not on file  . Frequency of Social Gatherings with Friends and Family: Not on file  . Attends Religious Services: Not on file  . Active Member of Clubs or Organizations: Not on file  . Attends Archivist Meetings: Not on file  . Marital Status: Not on file  Intimate Partner Violence:   . Fear of Current or Ex-Partner: Not on file  . Emotionally Abused: Not on file  . Physically Abused: Not on file  . Sexually Abused: Not on file    Physical Exam: Vital signs in last 24 hours: Temp:  [97.8 F (36.6 C)] 97.8 F (36.6 C) (01/21 0659) Pulse Rate:  [89] 89 (01/21 0659) Resp:  [26] 26 (01/21 0659) BP: (164)/(84) 164/84 (01/21 0659) SpO2:  [79 %]  79 % (01/21 0659) Weight:  [147.9 kg] 147.9 kg (01/21 0659)   GEN: NAD EYE: Sclerae anicteric ENT: MMM CV: Non-tachycardic GI: Soft, NT/ND NEURO:  Alert & Oriented x 3  Lab Results: No results for input(s): WBC, HGB, HCT, PLT in the last 72 hours. BMET No results for input(s): NA, K, CL, CO2, GLUCOSE, BUN, CREATININE, CALCIUM in the last 72 hours. LFT No results for input(s): PROT, ALBUMIN, AST, ALT, ALKPHOS, BILITOT, BILIDIR, IBILI in the last 72 hours. PT/INR No results for input(s): LABPROT, INR in the last 72 hours.   Impression / Plan: This is a 39 y.o.female who presents for EGD/Colonoscopy.  The risks and benefits of endoscopic evaluation were discussed with the patient; these include but are not limited to the risk of perforation, infection, bleeding, missed lesions, lack of diagnosis, severe illness  requiring hospitalization, as well as anesthesia and sedation related illnesses.  The patient is agreeable to proceed.    Corliss Parish, MD [facility] Advanced Endoscopy Office # [phone]

## 2019-12-30 NOTE — Transfer of Care (Signed)
Immediate Anesthesia Transfer of Care Note  Patient: Alexandra Rose  Procedure(s) Performed: ESOPHAGOGASTRODUODENOSCOPY (EGD) WITH PROPOFOL (N/A ) COLONOSCOPY WITH PROPOFOL (N/A )  Patient Location: PACU  Anesthesia Type:MAC  Level of Consciousness: drowsy and patient cooperative  Airway & Oxygen Therapy: Patient Spontanous Breathing  Post-op Assessment: Report given to RN  Post vital signs: Reviewed and stable  Last Vitals:  Vitals Value Taken Time  BP 91/56 12/30/19 0822  Temp    Pulse 94 12/30/19 0824  Resp 28 12/30/19 0824  SpO2 100 % 12/30/19 0824  Vitals shown include unvalidated device data.  Last Pain:  Vitals:   12/30/19 0659  TempSrc: Oral  PainSc: 4          Complications: No apparent anesthesia complications

## 2019-12-30 NOTE — Op Note (Addendum)
[facility] Patient Name: Alexandra Rose Procedure Date : 12/30/2019 MRN: [id number] Attending MD: Gabriel Mansouraty , MD Date of Birth: 02/25/1981 CSN: [id number] Age: 38 Admit Type: Outpatient Procedure:                Colonoscopy Indications:              Iron deficiency anemia Providers:                Gabriel Mansouraty, MD, Jenny Kappus, RN, Chris                            Chandler Tech., Technician, Julie Rennie, CRNA Referring MD:             Jennifer Lynne Lemmon, [facility] Medicines:                Monitored Anesthesia Care Complications:            No immediate complications. Estimated Blood Loss:     Estimated blood loss was minimal. Procedure:                Pre-Anesthesia Assessment:                           - Prior to the procedure, a History and Physical                            was performed, and patient medications and                            allergies were reviewed. The patient's tolerance of                            previous anesthesia was also reviewed. The risks                            and benefits of the procedure and the sedation                            options and risks were discussed with the patient.                            All questions were answered, and informed consent                            was obtained. Prior Anticoagulants: The patient has                            taken no previous anticoagulant or antiplatelet                            agents. ASA Grade Assessment: III - A patient with                              severe systemic disease. After reviewing the risks                            and benefits, the patient was deemed in                            satisfactory condition to undergo the procedure.                           After obtaining informed consent, the colonoscope                            was passed under direct vision. Throughout the                             procedure, the patient's blood pressure, pulse, and                            oxygen saturations were monitored continuously. The                            [id number]-HQ190L ([id number] Olympus colonoscope was                            introduced through the anus and advanced to the 10                            cm into the ileum. The colonoscopy was performed                            without difficulty. The patient tolerated the                            procedure. The quality of the bowel preparation was                            adequate. The terminal ileum, ileocecal valve,                            appendiceal orifice, and rectum were photographed. Scope In: 8:01:12 AM Scope Out: 8:14:32 AM Scope Withdrawal Time: 0 hours 9 minutes 43 seconds  Total Procedure Duration: 0 hours 13 minutes 20 seconds  Findings:      The digital rectal exam findings include hemorrhoids. Pertinent       negatives include no palpable rectal lesions.      The terminal ileum and ileocecal valve appeared normal. Biopsies were       taken with a cold forceps for histology to rule out Celiac disease.      A moderate amount of semi-liquid stool was found in the entire colon,       interfering with visualization. Lavage of the area was performed using       copious amounts, resulting in clearance with adequate visualization.      Normal mucosa was found in the entire colon otherwise.      Non-bleeding non-thrombosed   internal hemorrhoids were found during       retroflexion, during perianal exam and during digital exam. The       hemorrhoids were Grade II (internal hemorrhoids that prolapse but reduce       spontaneously). Impression:               - Hemorrhoids found on digital rectal exam.                           - The examined portion of the ileum was normal.                            Biopsied.                           - Stool in the entire examined colon. Lavaged with                             adequate visualization.                           - Normal mucosa in the entire examined colon                            otherwise.                           - Non-bleeding non-thrombosed internal hemorrhoids. Recommendation:           - The patient will be observed post-procedure,                            until all discharge criteria are met.                           - Discharge patient to home.                           - Patient has a contact number available for                            emergencies. The signs and symptoms of potential                            delayed complications were discussed with the                            patient. Return to normal activities tomorrow.                            Written discharge instructions were provided to the                            patient.                           - High fiber diet.                           -  Use FiberCon 1 tablet PO daily.                           - Continue present medications.                           - Await pathology results.                           - If patient has recurrent evidence of IDA and is                            appropriately supplemented orally with Iron then                            she will benefit from IV Iron x 2 doses and then                            discussion about role of VCE can be had.                           - Plan for repeat CBC/Iron/TIBC/Ferritin/B12/Folate                            in 8-weeks and clinic thereafter to discuss results                            and notation above.                           - Repeat colonoscopy for screening purposes at the                            age of 45.                           - The findings and recommendations were discussed                            with the patient.                           - The findings and recommendations were discussed                            with the designated responsible  adult. Procedure Code(s):        --- Professional ---                           45380, Colonoscopy, flexible; with biopsy, single                            or multiple Diagnosis Code(s):        --- Professional ---                             K64.1, Second degree hemorrhoids                           D50.9, Iron deficiency anemia, unspecified CPT copyright 2019 American Medical Association. All rights reserved. The codes documented in this report are preliminary and upon coder review may  be revised to meet current compliance requirements. Justice Britain, MD 12/30/2019 8:42:34 AM Number of Addenda: 0

## 2019-12-30 NOTE — Anesthesia Procedure Notes (Signed)
Procedure Name: MAC Date/Time: 12/30/2019 7:40 AM Performed by: Barrington Ellison, CRNA Pre-anesthesia Checklist: Patient identified, Emergency Drugs available, Suction available, Patient being monitored and Timeout performed Patient Re-evaluated:Patient Re-evaluated prior to induction Oxygen Delivery Method: Nasal cannula

## 2019-12-31 ENCOUNTER — Other Ambulatory Visit: Payer: Self-pay | Admitting: Physician Assistant

## 2020-01-03 LAB — SURGICAL PATHOLOGY

## 2020-01-04 ENCOUNTER — Telehealth: Payer: Self-pay | Admitting: *Deleted

## 2020-01-04 ENCOUNTER — Inpatient Hospital Stay: Payer: BC Managed Care – PPO | Attending: Internal Medicine

## 2020-01-04 ENCOUNTER — Telehealth: Payer: Self-pay

## 2020-01-04 ENCOUNTER — Other Ambulatory Visit: Payer: Self-pay

## 2020-01-04 DIAGNOSIS — E538 Deficiency of other specified B group vitamins: Secondary | ICD-10-CM | POA: Insufficient documentation

## 2020-01-04 DIAGNOSIS — D509 Iron deficiency anemia, unspecified: Secondary | ICD-10-CM | POA: Diagnosis present

## 2020-01-04 DIAGNOSIS — D508 Other iron deficiency anemias: Secondary | ICD-10-CM

## 2020-01-04 LAB — CMP (CANCER CENTER ONLY)
ALT: 19 U/L (ref 0–44)
AST: 14 U/L — ABNORMAL LOW (ref 15–41)
Albumin: 3.5 g/dL (ref 3.5–5.0)
Alkaline Phosphatase: 88 U/L (ref 38–126)
Anion gap: 7 (ref 5–15)
BUN: 9 mg/dL (ref 6–20)
CO2: 23 mmol/L (ref 22–32)
Calcium: 8.9 mg/dL (ref 8.9–10.3)
Chloride: 109 mmol/L (ref 98–111)
Creatinine: 1.05 mg/dL — ABNORMAL HIGH (ref 0.44–1.00)
GFR, Est AFR Am: >60 mL/min (ref >60)
GFR, Estimated: >60 mL/min (ref >60)
Glucose, Bld: 97 mg/dL (ref 70–99)
Potassium: 3.8 mmol/L (ref 3.5–5.1)
Sodium: 139 mmol/L (ref 135–145)
Total Bilirubin: 0.4 mg/dL (ref 0.3–1.2)
Total Protein: 7.6 g/dL (ref 6.5–8.1)

## 2020-01-04 LAB — CBC WITH DIFFERENTIAL (CANCER CENTER ONLY)
Abs Immature Granulocytes: 0.03 10*3/uL (ref 0.00–0.07)
Basophils Absolute: 0 10*3/uL (ref 0.0–0.1)
Basophils Relative: 0 %
Eosinophils Absolute: 0.1 10*3/uL (ref 0.0–0.5)
Eosinophils Relative: 1 %
HCT: 33.2 % — ABNORMAL LOW (ref 36.0–46.0)
Hemoglobin: 10.5 g/dL — ABNORMAL LOW (ref 12.0–15.0)
Immature Granulocytes: 0 %
Lymphocytes Relative: 31 %
Lymphs Abs: 3.2 10*3/uL (ref 0.7–4.0)
MCH: 27.6 pg (ref 26.0–34.0)
MCHC: 31.6 g/dL (ref 30.0–36.0)
MCV: 87.4 fL (ref 80.0–100.0)
Monocytes Absolute: 0.6 10*3/uL (ref 0.1–1.0)
Monocytes Relative: 6 %
Neutro Abs: 6.3 10*3/uL (ref 1.7–7.7)
Neutrophils Relative %: 62 %
Platelet Count: 366 10*3/uL (ref 150–400)
RBC: 3.8 MIL/uL — ABNORMAL LOW (ref 3.87–5.11)
RDW: 14.1 % (ref 11.5–15.5)
WBC Count: 10.3 10*3/uL (ref 4.0–10.5)
nRBC: 0 % (ref 0.0–0.2)

## 2020-01-04 LAB — SAMPLE TO BLOOD BANK

## 2020-01-04 LAB — VITAMIN B12: Vitamin B-12: 362 pg/mL (ref 180–914)

## 2020-01-04 NOTE — Telephone Encounter (Signed)
Return call to pt regarding abnormal bleeding After missing a few days of megace Rx Pt states she has been bleeding heavy x 7 days now Pt did contact PCP and was advised to contact us  Pt last seen 01/2019 Consulted with in office provider made pt aware she can take ibuprofen 600 mg  Every 6 hrs to help with bleeding. Pt made aware message would be sent to Dr.Constant for further advice  Pt voiced understanding.

## 2020-01-04 NOTE — Telephone Encounter (Addendum)
Has had heavy vaginal bleeding for 7 days, her GYN, Dr. Roger Shelter suggested she may need a transfusion. Called her back and moved her labs from 2/8 to today at 3 pm per her request. Will call her with results and need for blood. Notified patient that her Hgb is 10.5, thus transfusion is not indicated. She will f/u next week as scheduled and get back with her GYN about the heavy periods.

## 2020-01-04 NOTE — Telephone Encounter (Signed)
Pt made aware of provider advice and made aware to schedule appt to manage bleeding. Pt states she is communicating with her pcp regarding iron infusion and  hematologist waiting on response from hematologist. Patient also noted she has been back on megace.

## 2020-01-05 LAB — FERRITIN: Ferritin: 684 ng/mL — ABNORMAL HIGH (ref 11–307)

## 2020-01-09 ENCOUNTER — Encounter: Payer: Self-pay | Admitting: Gastroenterology

## 2020-01-10 ENCOUNTER — Other Ambulatory Visit: Payer: Self-pay

## 2020-01-10 ENCOUNTER — Telehealth: Payer: Self-pay | Admitting: *Deleted

## 2020-01-10 ENCOUNTER — Telehealth: Payer: Self-pay

## 2020-01-10 MED ORDER — PYLERA 140-125-125 MG PO CAPS: 3.0000 | ORAL_CAPSULE | Freq: Three times a day (TID) | ORAL | 0 refills | Status: DC

## 2020-01-10 MED ORDER — OMEPRAZOLE 40 MG PO CPDR: 40.0000 mg | DELAYED_RELEASE_CAPSULE | Freq: Every day | ORAL | 3 refills | Status: AC

## 2020-01-10 NOTE — Telephone Encounter (Addendum)
Reports she had EGD/colonoscopy on 12/30/19 by Dr. Meridee Score and has not heard from office w/results. Sent staff message for Dr. Meridee Score to have staff contact patient w/results.

## 2020-01-10 NOTE — Telephone Encounter (Signed)
-----   Message from Lemar Lofty., MD sent at 01/10/2020  1:11 PM EST ----- Regarding: RE: Results of Colonoscopy/Endosocopy Darl Pikes, Her letters and results have been reviewed and completed this weekend. Rosealynn Mateus had actually spoken with her and gone over things this morning as per notation in the chart. Ashon Rosenberg, can you reach out to the patient again this afternoon and see if she has any further questions? Thanks. GM ----- Message ----- From: Wandalee Ferdinand, RN Sent: 01/10/2020   1:09 PM EST To: Lemar Lofty., MD Subject: Results of Colonoscopy/Endosocopy              Dr. Meridee Score, Patient is calling our office for results of these procedures. Dr. Truett Perna is requesting your office reach out to her with the results. Phone # [phone]  Thank you, Kem Boroughs, RN

## 2020-01-10 NOTE — Telephone Encounter (Signed)
I spoke with the pt and she states she had made that call prior to speaking with me and has no further questions.

## 2020-01-17 ENCOUNTER — Other Ambulatory Visit: Payer: BC Managed Care – PPO

## 2020-01-17 ENCOUNTER — Inpatient Hospital Stay: Payer: BC Managed Care – PPO | Attending: Internal Medicine | Admitting: Oncology

## 2020-01-17 ENCOUNTER — Other Ambulatory Visit: Payer: Self-pay

## 2020-01-17 ENCOUNTER — Other Ambulatory Visit: Payer: Self-pay | Admitting: *Deleted

## 2020-01-17 VITALS — BP 98/57 | HR 109 | Temp 98.7°F | Resp 17 | Ht 67.0 in | Wt 326.9 lb

## 2020-01-17 DIAGNOSIS — D508 Other iron deficiency anemias: Secondary | ICD-10-CM | POA: Diagnosis not present

## 2020-01-17 DIAGNOSIS — D509 Iron deficiency anemia, unspecified: Secondary | ICD-10-CM | POA: Insufficient documentation

## 2020-01-17 DIAGNOSIS — N920 Excessive and frequent menstruation with regular cycle: Secondary | ICD-10-CM | POA: Insufficient documentation

## 2020-01-17 DIAGNOSIS — E538 Deficiency of other specified B group vitamins: Secondary | ICD-10-CM | POA: Insufficient documentation

## 2020-01-17 DIAGNOSIS — E669 Obesity, unspecified: Secondary | ICD-10-CM | POA: Diagnosis not present

## 2020-01-17 NOTE — Progress Notes (Signed)
  [facility] OFFICE PROGRESS NOTE   Diagnosis: Iron deficiency, vitamin B12 deficiency  INTERVAL HISTORY:   Alexandra Rose returns for scheduled visit.  She went underwent upper and lower endoscopy procedures by Dr. Meridee Rose on 12/30/2019.  There was erythematous mucosa in the gastric body and antrum.  The colonoscopy healed nonbleeding internal hemorrhoids.  The biopsy from the stomach revealed H. pylori.  She is completing treatment for H. pylori.  She reports the onset of a menstrual cycle 12/29/2019.  She reports heavy bleeding for 8-9 days.  This has improved but she continues to have vaginal bleeding with clots.  Alexandra Rose has chronic exertional dyspnea.  She relates this to obesity.  She had left-sided abdominal discomfort prior to beginning treatment for H. pylori.  This has improved.  Objective:  Vital signs in last 24 hours:  Blood pressure (!) 98/57, pulse (!) 109, temperature 98.7 F (37.1 C), temperature source Temporal, resp. rate 17, height 5\' 7"  (1.702 m), weight (!) 326 lb 14.4 oz (148.3 kg), SpO2 100 %.    Resp: Distant breath sounds, lungs clear bilaterally Cardio: Regular rate and rhythm GI: No mass, nontender, no hepatosplenomegaly Vascular: No leg edema   Lab Results:  Lab Results  Component Value Date   WBC 10.3 01/04/2020   HGB 10.5 (L) 01/04/2020   HCT 33.2 (L) 01/04/2020   MCV 87.4 01/04/2020   PLT 366 01/04/2020   NEUTROABS 6.3 01/04/2020    CMP  Lab Results  Component Value Date   NA 139 01/04/2020   K 3.8 01/04/2020   CL 109 01/04/2020   CO2 23 01/04/2020   GLUCOSE 97 01/04/2020   BUN 9 01/04/2020   CREATININE 1.05 (H) 01/04/2020   CALCIUM 8.9 01/04/2020   PROT 7.6 01/04/2020   ALBUMIN 3.5 01/04/2020   AST 14 (L) 01/04/2020   ALT 19 01/04/2020   ALKPHOS 88 01/04/2020   BILITOT 0.4 01/04/2020   GFRNONAA >60 01/04/2020   GFRAA >60 01/04/2020    Medications: I have reviewed the patient's current  medications.   Assessment/Plan: 1. History of iron deficiency anemia felt to be secondary to menorrhagia 2. B12 deficiency started on monthly B12 injections 07/19/2019 3. History of menorrhagia on Megace , recurrent heavy vaginal bleeding beginning 12/29/2019 4. 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 Obstructive sleep apnea 6. Obesity 7. Upper endoscopy/colonoscopy 12/30/2019-erythema in the stomach, biopsy confirmed H. pylori   Disposition: Ms. 01/01/2020 has a history of iron deficiency anemia and B12 deficiency.  She is maintained on oral vitamin B12 replacement.  The ferritin was normal on 01/04/2020, but the hemoglobin was lower.  I suspect the drop in the hemoglobin is related to heavy vaginal bleeding.  She continues to have vaginal bleeding today.  We noted blood on the exam table paper.  The lab was closed after her office visit today.  She will return for a CBC on 01/19/2020.  We will provide Red cell transfusion support as indicated.  We recommend she follow-up with her gynecologist this week to address the heavy vaginal bleeding.  She will be scheduled for an office visit in approximately 6 weeks.  03/18/2020, MD  01/17/2020  4:45 PM

## 2020-01-18 ENCOUNTER — Other Ambulatory Visit (HOSPITAL_COMMUNITY)
Admission: RE | Admit: 2020-01-18 | Discharge: 2020-01-18 | Disposition: A | Discharge status: Home / Self Care | Payer: BC Managed Care – PPO | Source: Ambulatory Visit | Attending: Obstetrics and Gynecology | Admitting: Obstetrics and Gynecology

## 2020-01-18 ENCOUNTER — Encounter: Payer: Self-pay | Admitting: Obstetrics and Gynecology

## 2020-01-18 ENCOUNTER — Ambulatory Visit (INDEPENDENT_AMBULATORY_CARE_PROVIDER_SITE_OTHER): Payer: BC Managed Care – PPO | Admitting: Obstetrics and Gynecology

## 2020-01-18 VITALS — BP 124/82 | HR 106 | Wt 329.0 lb

## 2020-01-18 DIAGNOSIS — N939 Abnormal uterine and vaginal bleeding, unspecified: Secondary | ICD-10-CM | POA: Insufficient documentation

## 2020-01-18 DIAGNOSIS — Z3202 Encounter for pregnancy test, result negative: Secondary | ICD-10-CM

## 2020-01-18 LAB — POCT URINE PREGNANCY: Preg Test, Ur: NEGATIVE

## 2020-01-18 MED ORDER — MEGESTROL ACETATE 40 MG PO TABS: 40.0000 mg | ORAL_TABLET | Freq: Two times a day (BID) | ORAL | 3 refills | Status: AC

## 2020-01-18 MED ORDER — FERROUS SULFATE 325 (65 FE) MG PO TABS: 325.0000 mg | ORAL_TABLET | Freq: Two times a day (BID) | ORAL | 1 refills | Status: DC

## 2020-01-18 NOTE — Progress Notes (Signed)
Pt is in the office for AUB, pt stated that she missed 2 doses of megace in Jan and has been bleeding ever since.

## 2020-01-18 NOTE — Progress Notes (Signed)
39 yo P2 with BMI 51 here to follow up on AUB. Patient reports AUB previously well controlled on megace. Patient had to discontinue megace in preparation for colonoscopy and reports heavy vaginal bleeding with passage of clots since 01/06/20. Patient denies feeling lightheaded or fatigue. She is still considering tubal reversal. Patient is without complaints  Past Medical History:  Diagnosis Date  . Allergies   . Anemia   . B12 deficiency 06/24/2019  . Blind   . Dyspnea   . GERD (gastroesophageal reflux disease)   . Hypertension   . Morbid obesity with BMI of 50.0-59.9, adult (HCC)   . Sleep apnea    wears CPAP    Past Surgical History:  Procedure Laterality Date  . BIOPSY  12/30/2019   Procedure: BIOPSY;  Surgeon: Meridee Score Netty Starring., MD;  Location: [facility] ENDOSCOPY;  Service: Gastroenterology;;  . CESAREAN SECTION    . CESAREAN SECTION    . COLONOSCOPY WITH PROPOFOL N/A 12/30/2019   Procedure: COLONOSCOPY WITH PROPOFOL;  Surgeon: Meridee Score Netty Starring., MD;  Location: [facility] ENDOSCOPY;  Service: Gastroenterology;  Laterality: N/A;  . ESOPHAGOGASTRODUODENOSCOPY (EGD) WITH PROPOFOL N/A 12/30/2019   Procedure: ESOPHAGOGASTRODUODENOSCOPY (EGD) WITH PROPOFOL;  Surgeon: Meridee Score Netty Starring., MD;  Location: [facility] ENDOSCOPY;  Service: Gastroenterology;  Laterality: N/A;  . TONSILLECTOMY    . TUBAL LIGATION  2015   Family History  Problem Relation Age of Onset  . Hypertension Father   . Diabetes Sister   . Hypertension Mother    Social History   Tobacco Use  . Smoking status: Current Some Day Smoker  . Smokeless tobacco: Never Used  . Tobacco comment: 'i finish a pack in about a week or two'  Substance Use Topics  . Alcohol use: Yes    Comment: 'a few glasses of wine per week'  . Drug use: Not Currently   ROS See pertinent in HPI  Blood pressure 124/82, pulse (!) 106, weight (!) 329 lb (149.2 kg).   GENERAL: Well-developed, well-nourished female in no acute distress. Patient is  visually impaired LUNGS: Clear to auscultation bilaterally.  HEART: Regular rate and rhythm. ABDOMEN: Soft, nontender, nondistended. No organomegaly. PELVIC: Normal external female genitalia. Vagina is pink and rugated.  Normal discharge. Normal appearing cervix. Bimanual exam limited secondary to body habitus. EXTREMITIES: No cyanosis, clubbing, or edema, 2+ distal pulses.  A/P 39 yo with AUB - Pelvic ultrasound ordered - Discussed benefits of endometrial biopsy.  ENDOMETRIAL BIOPSY     The indications for endometrial biopsy were reviewed.   Risks of the biopsy including cramping, bleeding, infection, uterine perforation, inadequate specimen and need for additional procedures  were discussed. The patient states she understands and agrees to undergo procedure today. Consent was signed. Time out was performed. Urine HCG was negative. A sterile speculum was placed in the patient's vagina and the cervix was prepped with Betadine. A single-toothed tenaculum was placed on the anterior lip of the cervix to stabilize it. The uterine cavity was sounded to a depth of 10 cm using the uterine sound. The 3 mm pipelle was introduced into the endometrial cavity without difficulty, 2 passes were made.  A  moderate amount of tissue was  sent to pathology. The instruments were removed from the patient's vagina. Minimal bleeding from the cervix was noted. The patient tolerated the procedure well.  Routine post-procedure instructions were given to the patient. The patient will follow up in two weeks to review the results and for further management.  - Discussed medical management  with IUD or depo-provera pending tubal reversal. Patient will do some research and let us know of her decision - Patient will be contacted with results

## 2020-01-19 ENCOUNTER — Telehealth: Payer: Self-pay | Admitting: Oncology

## 2020-01-19 NOTE — Telephone Encounter (Signed)
Scheduled per los. Called and spoke with patient. Confirmed all appts  °

## 2020-01-20 ENCOUNTER — Other Ambulatory Visit: Payer: Self-pay

## 2020-01-20 ENCOUNTER — Inpatient Hospital Stay: Payer: BC Managed Care – PPO

## 2020-01-20 DIAGNOSIS — D508 Other iron deficiency anemias: Secondary | ICD-10-CM

## 2020-01-20 DIAGNOSIS — D509 Iron deficiency anemia, unspecified: Secondary | ICD-10-CM | POA: Diagnosis not present

## 2020-01-20 LAB — CBC WITH DIFFERENTIAL (CANCER CENTER ONLY)
Abs Immature Granulocytes: 0.02 10*3/uL (ref 0.00–0.07)
Basophils Absolute: 0 10*3/uL (ref 0.0–0.1)
Basophils Relative: 0 %
Eosinophils Absolute: 0.1 10*3/uL (ref 0.0–0.5)
Eosinophils Relative: 1 %
HCT: 33 % — ABNORMAL LOW (ref 36.0–46.0)
Hemoglobin: 10.2 g/dL — ABNORMAL LOW (ref 12.0–15.0)
Immature Granulocytes: 0 %
Lymphocytes Relative: 20 %
Lymphs Abs: 1.4 10*3/uL (ref 0.7–4.0)
MCH: 27.4 pg (ref 26.0–34.0)
MCHC: 30.9 g/dL (ref 30.0–36.0)
MCV: 88.7 fL (ref 80.0–100.0)
Monocytes Absolute: 0.5 10*3/uL (ref 0.1–1.0)
Monocytes Relative: 7 %
Neutro Abs: 4.7 10*3/uL (ref 1.7–7.7)
Neutrophils Relative %: 72 %
Platelet Count: 364 10*3/uL (ref 150–400)
RBC: 3.72 MIL/uL — ABNORMAL LOW (ref 3.87–5.11)
RDW: 16 % — ABNORMAL HIGH (ref 11.5–15.5)
WBC Count: 6.7 10*3/uL (ref 4.0–10.5)
nRBC: 0 % (ref 0.0–0.2)

## 2020-01-20 LAB — SURGICAL PATHOLOGY

## 2020-01-20 LAB — SAMPLE TO BLOOD BANK

## 2020-01-21 ENCOUNTER — Telehealth: Payer: Self-pay | Admitting: *Deleted

## 2020-01-21 NOTE — Telephone Encounter (Signed)
Notified of stable Hgb. Call for s/s of need for transfusion such as shortness of breath, dizziness or extreme fatigue. She reports she is still having vaginal bleeding on and off without on a cycle, but not as heavy. She agrees to f/u as scheduled.

## 2020-01-27 ENCOUNTER — Telehealth: Payer: Self-pay | Admitting: Oncology

## 2020-01-27 NOTE — Telephone Encounter (Signed)
Called to confirm with pt appt on 2/25 cancelled - pt is aware and will come in for March appt.

## 2020-02-03 ENCOUNTER — Other Ambulatory Visit: Payer: BC Managed Care – PPO

## 2020-02-03 ENCOUNTER — Ambulatory Visit (HOSPITAL_COMMUNITY): Payer: BC Managed Care – PPO

## 2020-02-17 ENCOUNTER — Ambulatory Visit (HOSPITAL_COMMUNITY)
Admission: RE | Admit: 2020-02-17 | Discharge: 2020-02-17 | Disposition: A | Discharge status: Home / Self Care | Payer: BC Managed Care – PPO | Source: Ambulatory Visit | Attending: Obstetrics and Gynecology | Admitting: Obstetrics and Gynecology

## 2020-02-17 ENCOUNTER — Other Ambulatory Visit: Payer: Self-pay

## 2020-02-17 DIAGNOSIS — N939 Abnormal uterine and vaginal bleeding, unspecified: Secondary | ICD-10-CM | POA: Diagnosis not present

## 2020-02-18 ENCOUNTER — Telehealth: Payer: Self-pay

## 2020-02-18 NOTE — Telephone Encounter (Signed)
S/w pt and advised of u/s results and provider recommendations. Pt will check to see if insurance covers tubal reversal and follow up.

## 2020-02-21 ENCOUNTER — Other Ambulatory Visit: Payer: Self-pay | Admitting: Obstetrics and Gynecology

## 2020-02-21 DIAGNOSIS — Z3189 Encounter for other procreative management: Secondary | ICD-10-CM

## 2020-02-28 ENCOUNTER — Ambulatory Visit: Payer: BC Managed Care – PPO

## 2020-03-02 ENCOUNTER — Telehealth: Payer: Self-pay | Admitting: Nurse Practitioner

## 2020-03-02 ENCOUNTER — Other Ambulatory Visit: Payer: Self-pay

## 2020-03-02 ENCOUNTER — Inpatient Hospital Stay: Payer: BC Managed Care – PPO | Attending: Internal Medicine | Admitting: Nurse Practitioner

## 2020-03-02 ENCOUNTER — Encounter: Payer: Self-pay | Admitting: Nurse Practitioner

## 2020-03-02 ENCOUNTER — Inpatient Hospital Stay: Payer: BC Managed Care – PPO

## 2020-03-02 VITALS — BP 143/91 | HR 101 | Temp 97.8°F | Resp 20 | Wt 325.6 lb

## 2020-03-02 DIAGNOSIS — E538 Deficiency of other specified B group vitamins: Secondary | ICD-10-CM

## 2020-03-02 DIAGNOSIS — Z8616 Personal history of COVID-19: Secondary | ICD-10-CM | POA: Insufficient documentation

## 2020-03-02 DIAGNOSIS — E669 Obesity, unspecified: Secondary | ICD-10-CM | POA: Insufficient documentation

## 2020-03-02 DIAGNOSIS — D508 Other iron deficiency anemias: Secondary | ICD-10-CM | POA: Diagnosis not present

## 2020-03-02 DIAGNOSIS — D509 Iron deficiency anemia, unspecified: Secondary | ICD-10-CM | POA: Insufficient documentation

## 2020-03-02 LAB — CBC WITH DIFFERENTIAL (CANCER CENTER ONLY)
Abs Immature Granulocytes: 0.03 10*3/uL (ref 0.00–0.07)
Basophils Absolute: 0 10*3/uL (ref 0.0–0.1)
Basophils Relative: 0 %
Eosinophils Absolute: 0.1 10*3/uL (ref 0.0–0.5)
Eosinophils Relative: 1 %
HCT: 37.5 % (ref 36.0–46.0)
Hemoglobin: 11.8 g/dL — ABNORMAL LOW (ref 12.0–15.0)
Immature Granulocytes: 0 %
Lymphocytes Relative: 26 %
Lymphs Abs: 2.9 10*3/uL (ref 0.7–4.0)
MCH: 27.1 pg (ref 26.0–34.0)
MCHC: 31.5 g/dL (ref 30.0–36.0)
MCV: 86 fL (ref 80.0–100.0)
Monocytes Absolute: 0.4 10*3/uL (ref 0.1–1.0)
Monocytes Relative: 4 %
Neutro Abs: 7.5 10*3/uL (ref 1.7–7.7)
Neutrophils Relative %: 69 %
Platelet Count: 359 10*3/uL (ref 150–400)
RBC: 4.36 MIL/uL (ref 3.87–5.11)
RDW: 14.5 % (ref 11.5–15.5)
WBC Count: 11 10*3/uL — ABNORMAL HIGH (ref 4.0–10.5)
nRBC: 0 % (ref 0.0–0.2)

## 2020-03-02 LAB — FERRITIN: Ferritin: 546 ng/mL — ABNORMAL HIGH (ref 11–307)

## 2020-03-02 NOTE — Telephone Encounter (Signed)
Scheduled per los. Gave avs and calendar  

## 2020-03-02 NOTE — Progress Notes (Signed)
  [facility] OFFICE PROGRESS NOTE   Diagnosis: Iron deficiency, B12 deficiency  INTERVAL HISTORY:   Alexandra Rose returns as scheduled.  The vaginal bleeding has resolved.  She continues on Megace.  She reports being diagnosed with Covid on 01/25/2020.  She experienced loss of smell, body aches, headache, fatigue and congestion.  She continues to note fatigue.  She is following up with pulmonary for evaluation of dyspnea.  Objective:  Vital signs in last 24 hours:  Blood pressure (!) 143/91, pulse (!) 101, temperature 97.8 F (36.6 C), temperature source Temporal, resp. rate 20, weight (!) 325 lb 9.6 oz (147.7 kg), SpO2 100 %.   Resp: Lungs clear bilaterally. Cardio: Regular rate and rhythm. GI: Abdomen soft and nontender. Vascular: No leg edema.   Lab Results:  Lab Results  Component Value Date   WBC 11.0 (H) 03/02/2020   HGB 11.8 (L) 03/02/2020   HCT 37.5 03/02/2020   MCV 86.0 03/02/2020   PLT 359 03/02/2020   NEUTROABS 7.5 03/02/2020    Imaging:  No results found.  Medications: I have reviewed the patient's current medications.  Assessment/Plan: 1. History of iron deficiency anemia felt to be secondary to menorrhagia 2. B12 deficiency started on monthly B12 injections 07/19/2019 3. History of menorrhagia on Megace, recurrent heavy vaginal bleeding beginning 12/29/2019 4. 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 Obstructive sleep apnea 6. Obesity 7. Upper endoscopy/colonoscopy 12/30/2019-erythema in the stomach, biopsy confirmed H. Pylori 8. Covid + 11/23/2020  Disposition: Alexandra Rose has a history of iron deficiency anemia and B12 deficiency.  She continues oral B12.  She had a recent decline in her hemoglobin felt to be secondary to heavy vaginal bleeding.  The vaginal bleeding has resolved.  Review of the CBC from today shows improvement in the hemoglobin, 11.8.  We will follow-up on the ferritin.  She will return for labs in 3 months, lab  and follow-up in 6 months.  Plan reviewed with Dr. Truett Perna.    Lonna Cobb ANP/GNP-BC   03/02/2020  3:26 PM

## 2020-03-03 ENCOUNTER — Ambulatory Visit (INDEPENDENT_AMBULATORY_CARE_PROVIDER_SITE_OTHER): Payer: BC Managed Care – PPO | Admitting: Gastroenterology

## 2020-03-03 ENCOUNTER — Encounter: Payer: Self-pay | Admitting: Gastroenterology

## 2020-03-03 VITALS — BP 118/80 | HR 90 | Temp 97.8°F | Ht 67.0 in | Wt 325.0 lb

## 2020-03-03 DIAGNOSIS — Z8619 Personal history of other infectious and parasitic diseases: Secondary | ICD-10-CM | POA: Diagnosis not present

## 2020-03-03 DIAGNOSIS — Z862 Personal history of diseases of the blood and blood-forming organs and certain disorders involving the immune mechanism: Secondary | ICD-10-CM

## 2020-03-03 DIAGNOSIS — K2950 Unspecified chronic gastritis without bleeding: Secondary | ICD-10-CM | POA: Diagnosis not present

## 2020-03-03 NOTE — Patient Instructions (Signed)
Your provider has requested that you go to the basement level for lab work in 3-4 weeks. Press "B" on the elevator. The lab is located at the first door on the left as you exit the elevator.  If you are age 39 or older, your body mass index should be between 23-30. Your Body mass index is 50.9 kg/m. If this is out of the aforementioned range listed, please consider follow up with your Primary Care Provider.  If you are age 2 or younger, your body mass index should be between 19-25. Your Body mass index is 50.9 kg/m. If this is out of the aformentioned range listed, please consider follow up with your Primary Care Provider.   Due to recent changes in healthcare laws, you may see the results of your imaging and laboratory studies on MyChart before your provider has had a chance to review them.  We understand that in some cases there may be results that are confusing or concerning to you. Not all laboratory results come back in the same time frame and the provider may be waiting for multiple results in order to interpret others.  Please give [place] 48 hours in order for your provider to thoroughly review all the results before contacting the office for clarification of your results.   Follow-up as needed.   Thank you for choosing me and [facility].  Dr. Meridee Score

## 2020-03-03 NOTE — Progress Notes (Signed)
GASTROENTEROLOGY OUTPATIENT CLINIC VISIT   [facility] [address] [place] [address] [phone]  Patient Profile: Anel Creighton is a 39 y.o. female with a pmh significant for legal blindness, morbid obesity, sleep apnea, hypertension, history of anemia (prior B12 deficiency and slight iron insufficiency), GERD, chronic gastritis with history H. pylori infection (eradication has not been confirmed).  The patient presents to the [facility] for an evaluation and management of problem(s) noted below:  Problem List 1. History of iron deficiency anemia   2. Chronic gastritis without bleeding, unspecified gastritis type   3. History of Helicobacter infection     History of Present Illness Please see initial consultation note by PA [facility] for full details of HPI.  Interval History When she initially been evaluated in clinic she was having issues of anemia and iron deficiency as well as nausea/vomiting.  Her endoscopic evaluation showed evidence of H. pylori infection and chronic gastritis.  A colonoscopy was performed as well in the setting of her iron deficiency anemia and was unremarkable.  She had no evidence of celiac disease.  The patient recently followed up with her hematologist.  Recent CBC showed an improvement in her anemia and she is no longer anemic.  She has not had new iron indices or vitamin B12/folate over the course the last few months.  The patient states she is doing well otherwise.  Significant nausea and vomiting is no longer present.  She did have Covid within the last month.  During that time she did not feel well.  She stopped many of her medications.  She has been off her PPI for now.  She currently is not having worsening GERD at this time.  The patient uses MiraLAX to help her have bowel movements if she has constipation.  Patient has no other significant GI issues at this  time.  Patient not taking significant nonsteroidals or BC/Goody powders.  The patient still has issues of fatigue which she is discussed with her primary care doctor even though her anemia has normalized.  The patient has an upcoming clinic visit with pulmonary to further evaluate some issues with breathing.  GI Review of Systems Positive as above Negative for dysphagia, odynophagia, decreased appetite, early satiety, melena, hematochezia  Review of Systems General: Denies fevers/chills/weight loss HEENT: Denies oral lesions Cardiovascular: Denies chest pain Pulmonary: Mild shortness of breath which has been stable over the course the last few weeks and is awaiting a pulmonary consultation Gastroenterological: See HPI Genitourinary: Denies darkened urine Hematological: Denies easy bruising/bleeding Dermatological: Denies jaundice Psychological: Mood is stable   Medications Current Outpatient Medications  Medication Sig Dispense Refill  . Ascorbic Acid (VITAMIN C) 100 MG tablet Vitamin C    . cetirizine (ZYRTEC) 10 MG tablet Take 10 mg by mouth daily.     Marland Kitchen EPINEPHrine 0.3 mg/0.3 mL IJ SOAJ injection Inject into the muscle as needed.    . Ipratropium-Albuterol (COMBIVENT RESPIMAT) 20-100 MCG/ACT AERS respimat Inhale 1 puff into the lungs every 6 (six) hours.    . megestrol (MEGACE) 40 MG tablet Take 1 tablet (40 mg total) by mouth 2 (two) times daily. 60 tablet 3  . omeprazole (PRILOSEC) 40 MG capsule Take 1 capsule (40 mg total) by mouth daily. 90 capsule 3  . polyethylene glycol (MIRALAX / GLYCOLAX) 17 g packet TK 17 G PO D FOR 3 DAYS    . vitamin B-12 (CYANOCOBALAMIN) 1000 MCG tablet  Take 1,000 mcg by mouth daily.     No current facility-administered medications for this visit.    Allergies Allergies  Allergen Reactions  . Tomato Nausea And Vomiting  . Shellfish Allergy     Histories Past Medical History:  Diagnosis Date  . Allergies   . Anemia   . B12 deficiency  06/24/2019  . Blind   . Dyspnea   . GERD (gastroesophageal reflux disease)   . Hypertension   . Morbid obesity with BMI of 50.0-59.9, adult (HCC)   . Sleep apnea    wears CPAP    Past Surgical History:  Procedure Laterality Date  . BIOPSY  12/30/2019   Procedure: BIOPSY;  Surgeon: Meridee Score Netty Starring., MD;  Location: [facility] ENDOSCOPY;  Service: Gastroenterology;;  . CESAREAN SECTION    . CESAREAN SECTION    . COLONOSCOPY WITH PROPOFOL N/A 12/30/2019   Procedure: COLONOSCOPY WITH PROPOFOL;  Surgeon: Meridee Score Netty Starring., MD;  Location: [facility] ENDOSCOPY;  Service: Gastroenterology;  Laterality: N/A;  . ESOPHAGOGASTRODUODENOSCOPY (EGD) WITH PROPOFOL N/A 12/30/2019   Procedure: ESOPHAGOGASTRODUODENOSCOPY (EGD) WITH PROPOFOL;  Surgeon: Meridee Score Netty Starring., MD;  Location: [facility] ENDOSCOPY;  Service: Gastroenterology;  Laterality: N/A;  . TONSILLECTOMY    . TUBAL LIGATION  2015   Social History   Socioeconomic History  . Marital status: Significant Other    Spouse name: Not on file  . Number of children: Not on file  . Years of education: Not on file  . Highest education level: Not on file  Occupational History  . Not on file  Tobacco Use  . Smoking status: Current Some Day Smoker  . Smokeless tobacco: Never Used  . Tobacco comment: 'i finish a pack in about a week or two'  Substance and Sexual Activity  . Alcohol use: Yes    Comment: 'a few glasses of wine per week'  . Drug use: Not Currently  . Sexual activity: Yes  Other Topics Concern  . Not on file  Social History Narrative   Lives with boyfriend at this time   Employed   Social Determinants of Corporate investment banker Strain:   . Difficulty of Paying Living Expenses:   Food Insecurity:   . Worried About Programme researcher, broadcasting/film/video in the Last Year:   . Barista in the Last Year:   Transportation Needs:   . Freight forwarder (Medical):   Marland Kitchen Lack of Transportation (Non-Medical):   Physical Activity:   . Days of  Exercise per Week:   . Minutes of Exercise per Session:   Stress:   . Feeling of Stress :   Social Connections:   . Frequency of Communication with Friends and Family:   . Frequency of Social Gatherings with Friends and Family:   . Attends Religious Services:   . Active Member of Clubs or Organizations:   . Attends Banker Meetings:   Marland Kitchen Marital Status:   Intimate Partner Violence:   . Fear of Current or Ex-Partner:   . Emotionally Abused:   Marland Kitchen Physically Abused:   . Sexually Abused:    Family History  Problem Relation Age of Onset  . Hypertension Father   . Diabetes Sister   . Hypertension Mother   . Colon cancer Neg Hx   . Esophageal cancer Neg Hx   . Inflammatory bowel disease Neg Hx   . Liver disease Neg Hx   . Pancreatic cancer Neg Hx   . Rectal cancer Neg Hx   .  Stomach cancer Neg Hx    I have reviewed her medical, social, and family history in detail and updated the electronic medical record as necessary.    PHYSICAL EXAMINATION  BP 118/80   Pulse 90   Temp 97.8 F (36.6 C)   Ht 5\' 7"  (1.702 m)   Wt (!) 325 lb (147.4 kg)   BMI 50.90 kg/m  Wt Readings from Last 3 Encounters:  03/03/20 (!) 325 lb (147.4 kg)  03/02/20 (!) 325 lb 9.6 oz (147.7 kg)  01/18/20 (!) 329 lb (149.2 kg)  GEN: NAD, appears stated age, doesn't appear chronically ill PSYCH: Cooperative, without pressured speech EYE: Conjunctivae pink, sclerae anicteric ENT: MMM CV: Nontachycardic RESP: Decreased breath sounds at the bases bilaterally GI: Obese, distended, without guarding MSK/EXT: Lower extremity edema present bilaterally SKIN: No jaundice NEURO:  Alert & Oriented x 3, no focal deficits   REVIEW OF DATA  I reviewed the following data at the time of this encounter:  GI Procedures and Studies  January 2021 EGD - No gross lesions in esophagus. Biopsied. Z-line irregular, 35 cm from the incisors. - Small sliding hiatal hernia. - Erythematous mucosa in the gastric body  and antrum. No other gross lesions in the stomach. Biopsied. - No gross lesions in the duodenal bulb, in the first portion of the duodenum and in the second portion of the duodenum. Biopsied.  January 2021 colonoscopy - Hemorrhoids found on digital rectal exam. - The examined portion of the ileum was normal. Biopsied. - Stool in the entire examined colon. Lavaged with adequate visualization. - Normal mucosa in the entire examined colon otherwise. - Non-bleeding non-thrombosed internal hemorrhoids.  Pathology FINAL MICROSCOPIC DIAGNOSIS:  A. DUODENUM, BIOPSY:  - Benign duodenal mucosa  - No acute inflammation, villous blunting or increased intraepithelial  lymphocytes identified  B. STOMACH, RANDOM, BIOPSY:  - Mild chronic gastritis without activity  - No intestinal metaplasia identified  - See comment  C. ESOPHAGUS, BIOPSY:  - Benign Squamous mucosa  - No increased intraepithelial eosinophils  D. ILEUM, BIOPSY:  - Benign small bowel mucosa with lymphoid aggregates  - No acute inflammation, granulomas or malignancy identified  COMMENT:  A-D. The parts of this case were confirmed with Dr. February 2021.  C. H. pylori immunohistochemistry is pending and will be reported in an  addendum.  ADDENDUM:  B. H. pylori immunohistochemistry is POSITIVE for RARE   Laboratory Studies  Reviewed those in epic  Imaging Studies  No relevant studies to review   ASSESSMENT  Ms. Coppess is a 39 y.o. female with a pmh significant for legal blindness, morbid obesity, sleep apnea, hypertension, history of anemia (prior B12 deficiency and slight iron insufficiency), GERD, chronic gastritis with history H. pylori infection (eradication has not been confirmed).  The patient is seen today for evaluation and management of:  1. History of iron deficiency anemia   2. Chronic gastritis without bleeding, unspecified gastritis type   3. History of Helicobacter infection    The patient is  hemodynamically and clinically stable.  Patient's recent endoscopic evaluation showed evidence of chronic gastritis with H. pylori infection.  She is status post Pylera treatment.  She needs to be evaluated for eradication of H. pylori.  We briefly discussed the natural history of H. pylori infections and how the 20 still considers this a carcinogen and we need to ensure eradication.  Patient is not having significant GI symptoms at this time.  She has a history of GERD but is  currently off her PPI after recent Covid-19 infection within the last 6 weeks.  This is the right time as she is off her PPI for H. pylori eradication evaluation.  She will submit a stool study.  Once she is submitted for stool studies she can restart her PPI as needed for GERD symptoms.  Unless the patient has evidence of recurrent iron deficiency anemia in the setting of IV iron infusions I would hold on a video capsule endoscopy at this time.  If the patient has recurrent issues of nausea/vomiting I would recommend that we obtain a TSH and cortisol level.  I will recheck her iron indices as well as B12 and folate as well.  The patient should undergo colon cancer screening at the age of 19.  All patient questions were answered, to the best of my ability, and the patient agrees to the aforementioned plan of action with follow-up as indicated.  She will be followed up on a as needed basis based on the findings of her labs and stool studies   PLAN  Laboratories as outlined below in future status H. pylori stool antigen to evaluate eradication of HP PPI may be restarted for patient's GERD symptoms after H. pylori stool antigen has been submitted Colon cancer screening at age 59 Video capsule endoscopy to be considered if patient has recurrent iron deficiency anemia and requires IV iron infusions and persists   Orders Placed This Encounter  Procedures  . Helicobacter pylori special antigen  . CBC w/Diff  .  IBC + Ferritin  . B12  . Folate    New Prescriptions   No medications on file   Modified Medications   No medications on file    Planned Follow Up No follow-ups on file.   Total Time in Face-to-Face and in Coordination of Care for patient including independent/personal interpretation/review of prior testing, medical history, examination, medication adjustment, communicating results with the patient directly, and documentation with the EHR is 25 minutes.   Justice Britain, MD [facility] Advanced Endoscopy Office # [phone]

## 2020-03-23 ENCOUNTER — Ambulatory Visit: Payer: BC Managed Care – PPO | Attending: Internal Medicine

## 2020-03-23 DIAGNOSIS — Z23 Encounter for immunization: Secondary | ICD-10-CM

## 2020-03-23 NOTE — Progress Notes (Signed)
   Covid-19 Vaccination Clinic  Name:  Alexandra Rose    MRN: [id number] DOB: 1981-08-04  03/23/2020  Alexandra Rose was observed post Covid-19 immunization for 15 minutes without incident. She was provided with Vaccine Information Sheet and instruction to access the V-Safe system.   Alexandra Rose was instructed to call 911 with any severe reactions post vaccine: Marland Kitchen Difficulty breathing  . Swelling of face and throat  . A fast heartbeat  . A bad rash all over body  . Dizziness and weakness   Immunizations Administered    Name Date Dose VIS Date Route   Pfizer COVID-19 Vaccine 03/23/2020  9:33 AM 0.3 mL 11/19/2019 Intramuscular   Manufacturer: [employer], [employer]   Lot: [id number]   NDC: [address]-7

## 2020-04-17 ENCOUNTER — Ambulatory Visit: Payer: BC Managed Care – PPO | Attending: Internal Medicine

## 2020-04-17 DIAGNOSIS — Z23 Encounter for immunization: Secondary | ICD-10-CM

## 2020-04-17 NOTE — Progress Notes (Signed)
   Covid-19 Vaccination Clinic  Name:  Alexandra Rose    MRN: [id number] DOB: 02-23-1981  04/17/2020  [name] was observed post Covid-19 immunization for 15 minutes without incident. She was provided with Vaccine Information Sheet and instruction to access the V-Safe system.   [name] was instructed to call 911 with any severe reactions post vaccine: Marland Kitchen Difficulty breathing  . Swelling of face and throat  . A fast heartbeat  . A bad rash all over body  . Dizziness and weakness   Immunizations Administered    Name Date Dose VIS Date Route   Pfizer COVID-19 Vaccine 04/17/2020  9:52 AM 0.3 mL 02/02/2019 Intramuscular   Manufacturer: [employer], [employer]   Lot: [id number]   NDC: [address]-6

## 2020-04-19 ENCOUNTER — Telehealth: Payer: Self-pay | Admitting: *Deleted

## 2020-04-19 NOTE — Telephone Encounter (Addendum)
Reports feeling extreme fatigue. No longer having heavy periods. Had labs checked at [facility] on 04/06/20 (In CareEverywhere). Was told her iron levels are low and asking if she would be able to have an iron infusion?  Called her back after MD reviewed labs. Her iron is normal and Hgb 12.0. States fatigue is not related to her anemia. F/U with PCP.

## 2020-06-01 ENCOUNTER — Inpatient Hospital Stay: Payer: BC Managed Care – PPO | Attending: Internal Medicine

## 2020-06-30 IMAGING — US US PELVIS COMPLETE WITH TRANSVAGINAL
1 series · 15 of 25 positions shown · non-contrast
Comparison: None

CLINICAL DATA: Patient with abnormal uterine bleeding.



[Series 1: us pelvis complete with transvaginal · 15 of 76 slices shown]
[im 1/76]
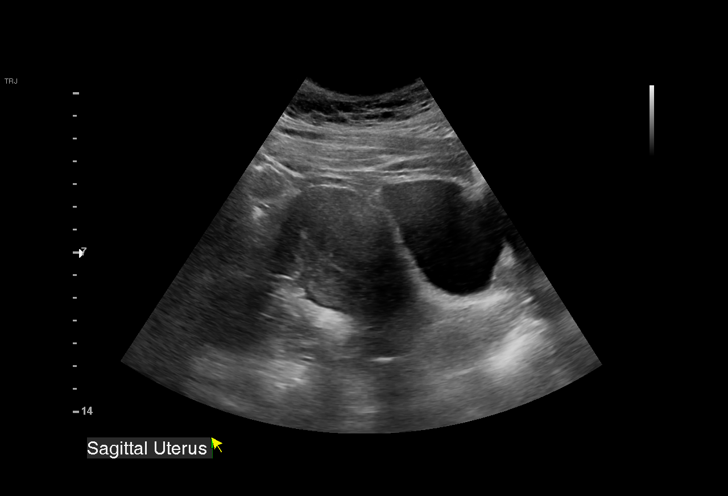
[im 7/76]
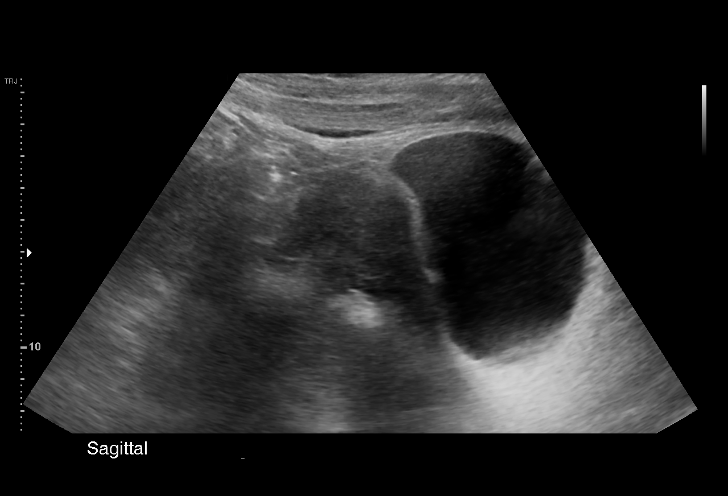
[im 13/76]
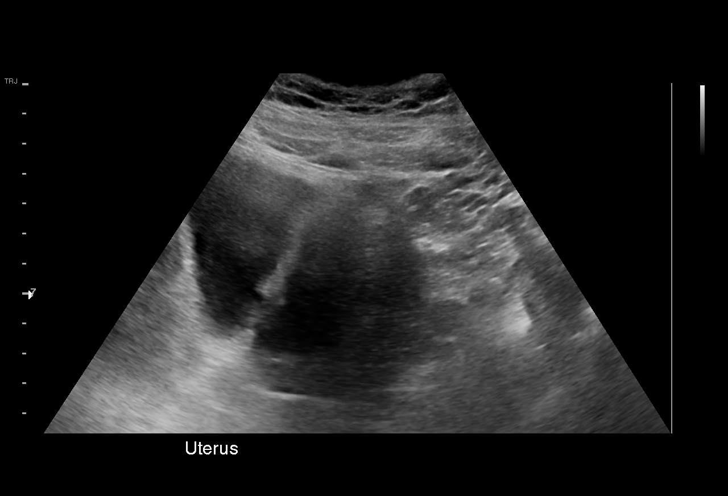
[im 16/76]
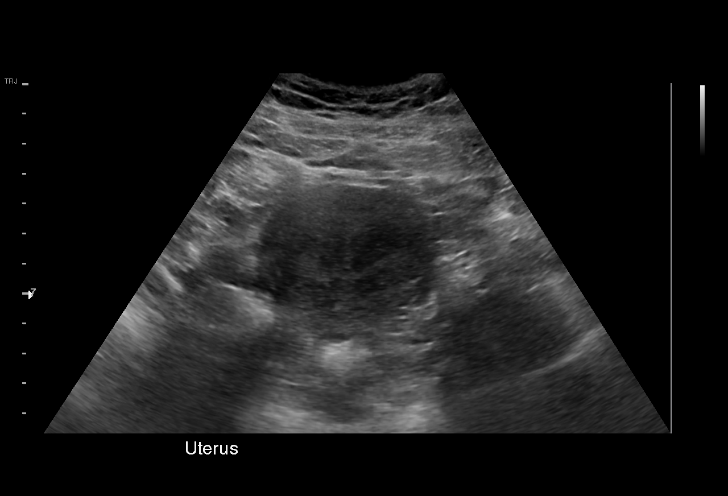
[im 22/76]
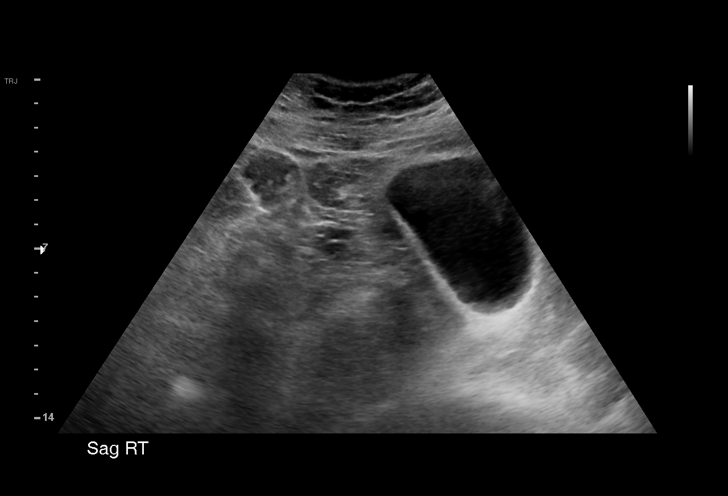
[im 29/76]
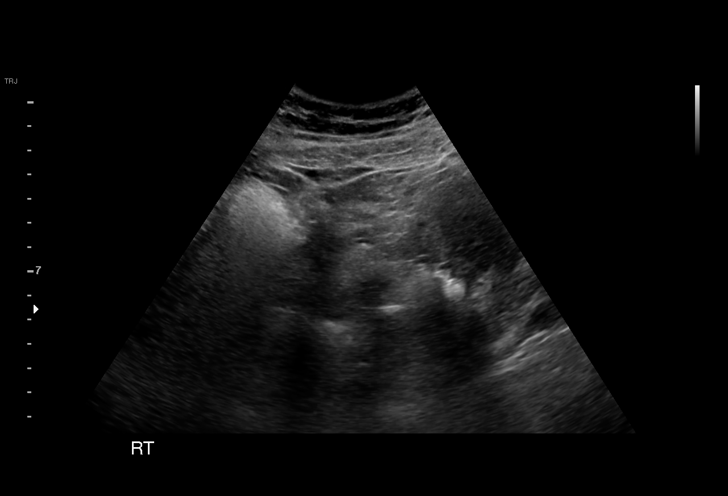
[im 32/76]
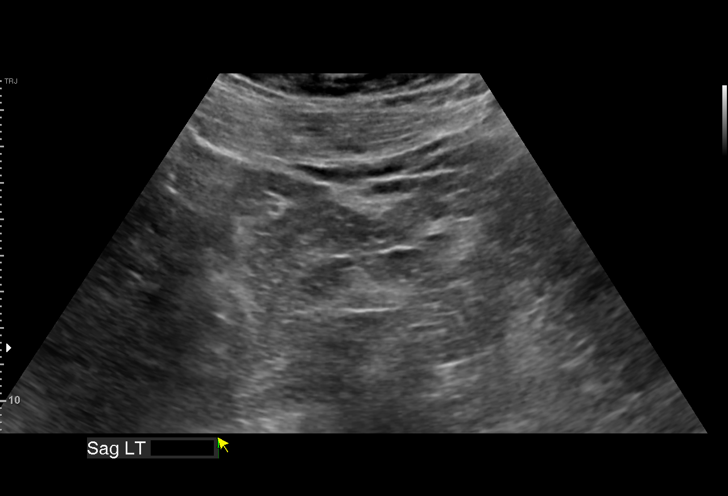
[im 38/76]
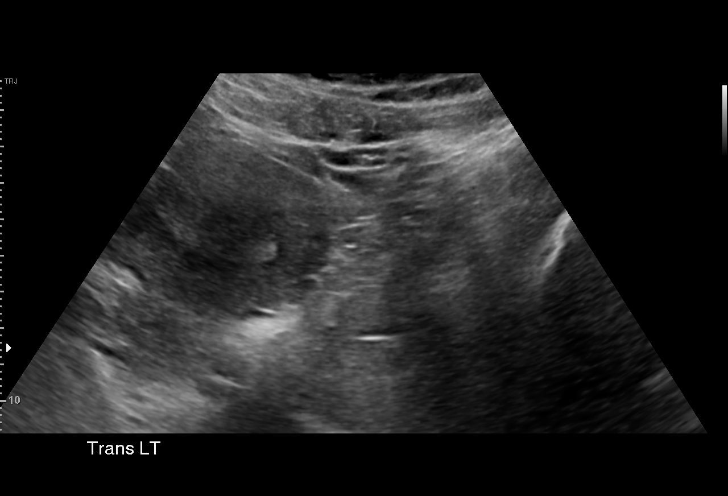
[im 44/76]
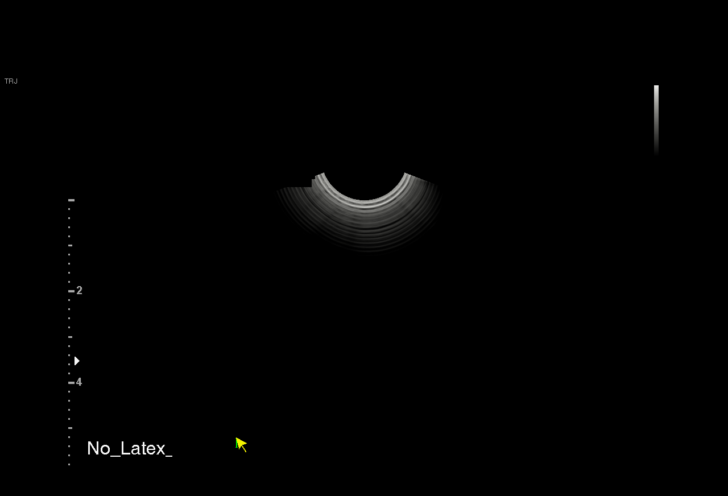
[im 47/76]
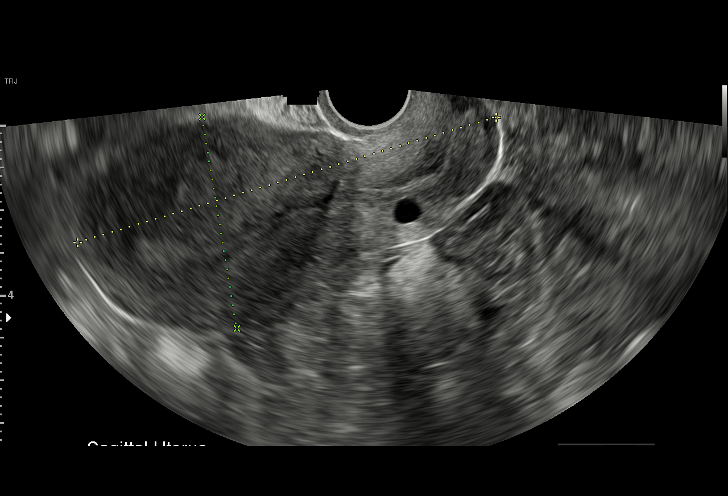
[im 54/76]
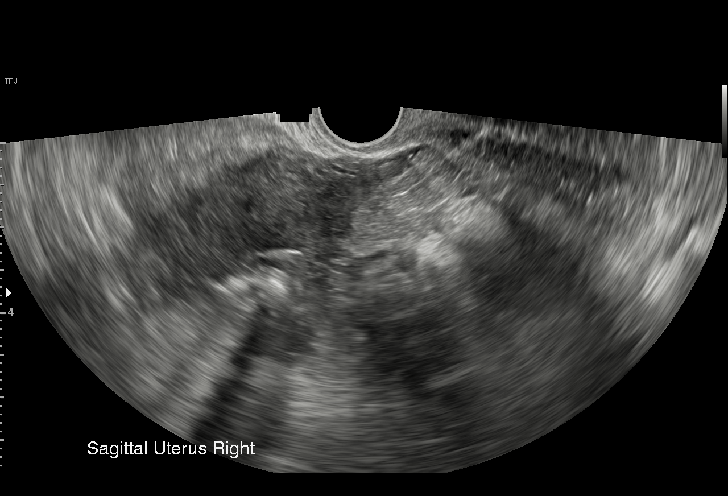
[im 60/76]
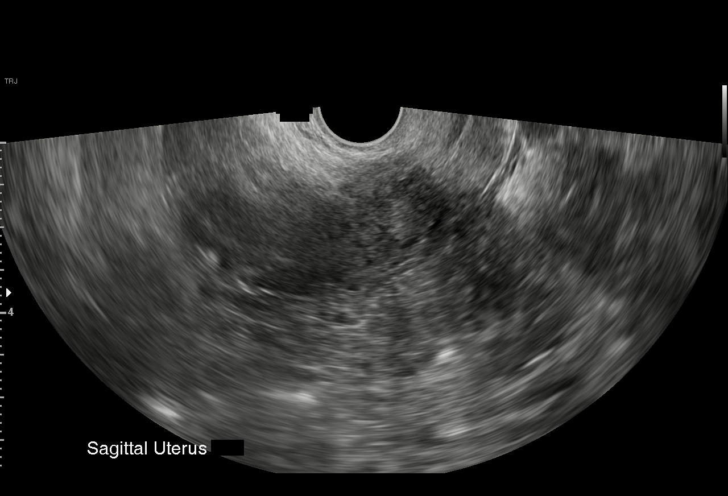
[im 63/76]
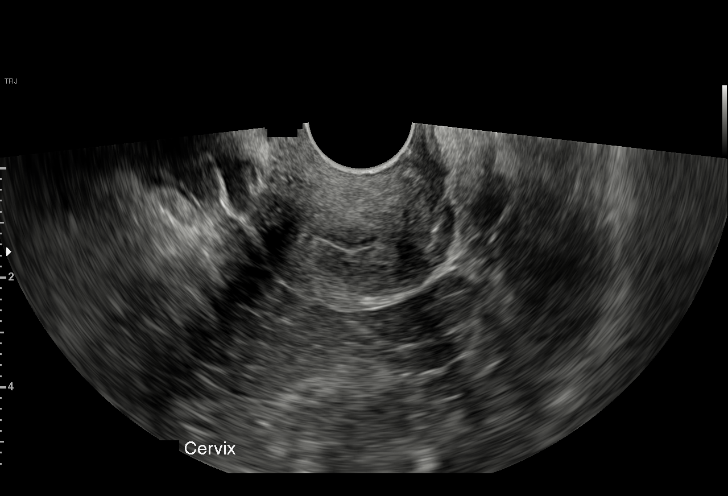
[im 69/76]
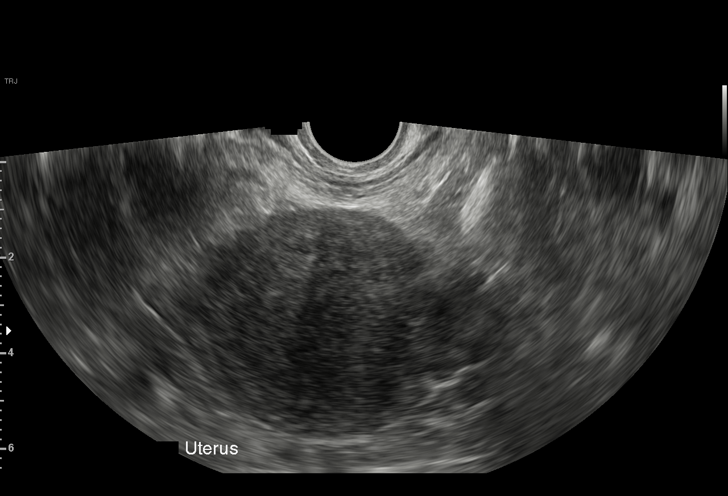
[im 76/76]
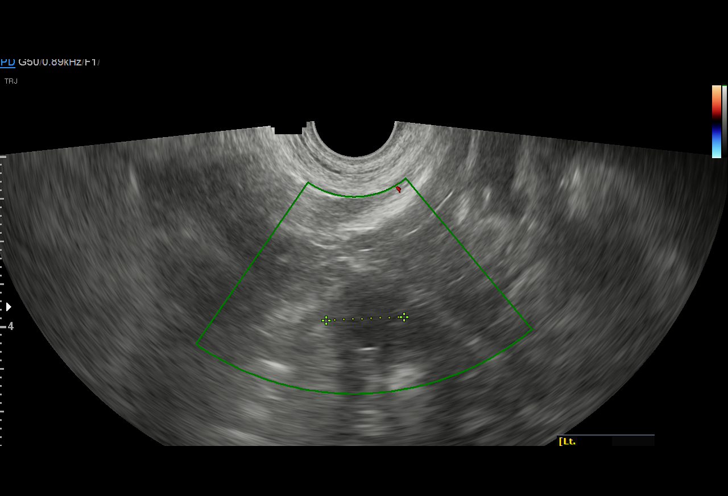

[15 of 25 positions shown; findings below may reference images not displayed]

FINDINGS: Uterus

Measurements: 10.9 x 5.0 x 5.8 cm = volume: 164 mL. No fibroids or
other mass visualized.

Endometrium

Thickness: 6.6 mm.  Small amount of fluid.

Right ovary

Measurements: 2.6 x 1.7 x 1.8 cm = volume: 4.1 mL. Normal
appearance/no adnexal mass.

Left ovary

Measurements: 2.6 x 1.2 x 1.7 cm = volume: 2.7 mL. Normal
appearance/no adnexal mass.

Other findings

No abnormal free fluid.
IMPRESSION: Endometrium measures approximately 7 mm. If bleeding remains
unresponsive to hormonal or medical therapy, sonohysterogram should
be considered for focal lesion work-up. (Ref: Radiological
Reasoning: Algorithmic Workup of Abnormal Vaginal Bleeding with
Endovaginal Sonography and Sonohysterography. AJR [id number]; 191:S68-73)

## 2020-08-10 ENCOUNTER — Telehealth: Payer: Self-pay | Admitting: Oncology

## 2020-08-10 NOTE — Telephone Encounter (Signed)
Rescheduled appointments per 9/2 provider message. Patient is aware of scheduling change.

## 2020-08-29 ENCOUNTER — Ambulatory Visit
Admission: RE | Admit: 2020-08-29 | Discharge: 2020-08-29 | Disposition: A | Discharge status: Home / Self Care | Payer: BC Managed Care – PPO | Source: Ambulatory Visit | Attending: Family Medicine | Admitting: Family Medicine

## 2020-08-29 ENCOUNTER — Other Ambulatory Visit: Payer: Self-pay | Admitting: Family Medicine

## 2020-08-29 DIAGNOSIS — R609 Edema, unspecified: Secondary | ICD-10-CM

## 2020-08-29 DIAGNOSIS — W19XXXA Unspecified fall, initial encounter: Secondary | ICD-10-CM

## 2020-08-30 ENCOUNTER — Other Ambulatory Visit: Payer: Self-pay | Admitting: Student

## 2020-08-30 DIAGNOSIS — R609 Edema, unspecified: Secondary | ICD-10-CM

## 2020-08-31 ENCOUNTER — Inpatient Hospital Stay (HOSPITAL_BASED_OUTPATIENT_CLINIC_OR_DEPARTMENT_OTHER): Payer: BC Managed Care – PPO | Admitting: Oncology

## 2020-08-31 ENCOUNTER — Inpatient Hospital Stay: Payer: BC Managed Care – PPO | Attending: Oncology

## 2020-08-31 ENCOUNTER — Other Ambulatory Visit: Payer: Self-pay

## 2020-08-31 ENCOUNTER — Other Ambulatory Visit: Payer: BC Managed Care – PPO

## 2020-08-31 ENCOUNTER — Telehealth: Payer: Self-pay | Admitting: Oncology

## 2020-08-31 ENCOUNTER — Ambulatory Visit: Payer: BC Managed Care – PPO | Admitting: Oncology

## 2020-08-31 VITALS — BP 126/80 | HR 92 | Temp 97.8°F | Resp 17 | Ht 67.0 in | Wt 320.5 lb

## 2020-08-31 DIAGNOSIS — E669 Obesity, unspecified: Secondary | ICD-10-CM | POA: Diagnosis not present

## 2020-08-31 DIAGNOSIS — D509 Iron deficiency anemia, unspecified: Secondary | ICD-10-CM | POA: Diagnosis present

## 2020-08-31 DIAGNOSIS — E538 Deficiency of other specified B group vitamins: Secondary | ICD-10-CM | POA: Diagnosis not present

## 2020-08-31 DIAGNOSIS — D508 Other iron deficiency anemias: Secondary | ICD-10-CM | POA: Diagnosis not present

## 2020-08-31 DIAGNOSIS — M7989 Other specified soft tissue disorders: Secondary | ICD-10-CM | POA: Diagnosis not present

## 2020-08-31 LAB — CBC WITH DIFFERENTIAL (CANCER CENTER ONLY)
Abs Immature Granulocytes: 0.02 10*3/uL (ref 0.00–0.07)
Basophils Absolute: 0 10*3/uL (ref 0.0–0.1)
Basophils Relative: 0 %
Eosinophils Absolute: 0.1 10*3/uL (ref 0.0–0.5)
Eosinophils Relative: 1 %
HCT: 38.4 % (ref 36.0–46.0)
Hemoglobin: 12.1 g/dL (ref 12.0–15.0)
Immature Granulocytes: 0 %
Lymphocytes Relative: 29 %
Lymphs Abs: 3 10*3/uL (ref 0.7–4.0)
MCH: 25.5 pg — ABNORMAL LOW (ref 26.0–34.0)
MCHC: 31.5 g/dL (ref 30.0–36.0)
MCV: 81 fL (ref 80.0–100.0)
Monocytes Absolute: 0.5 10*3/uL (ref 0.1–1.0)
Monocytes Relative: 5 %
Neutro Abs: 6.6 10*3/uL (ref 1.7–7.7)
Neutrophils Relative %: 65 %
Platelet Count: 378 10*3/uL (ref 150–400)
RBC: 4.74 MIL/uL (ref 3.87–5.11)
RDW: 15.1 % (ref 11.5–15.5)
WBC Count: 10.3 10*3/uL (ref 4.0–10.5)
nRBC: 0 % (ref 0.0–0.2)

## 2020-08-31 LAB — FERRITIN: Ferritin: 460 ng/mL — ABNORMAL HIGH (ref 11–307)

## 2020-08-31 LAB — VITAMIN B12: Vitamin B-12: 258 pg/mL (ref 180–914)

## 2020-08-31 NOTE — Progress Notes (Signed)
   [facility] OFF[facility] PROGRESS NOTE   Diagnosis: Iron deficiency anemia, B12 deficiency  INTERVAL HISTORY:   Alexandra Rose returns as scheduled.  She continues iron and vitamin B12.  She no longer has vaginal bleeding since starting Megace.  She fell greater than 1 month ago and injured the right knee.  She wears a brace.  She has mild swelling of the right leg when wearing the brace.  No other complaint.  Objective:  Vital signs in last 24 hours:  Blood pressure 126/80, pulse 92, temperature 97.8 F (36.6 C), temperature source Tympanic, resp. rate 17, height 5\' 7"  (1.702 m), weight (!) 320 lb 8 oz (145.4 kg), SpO2 100 %.    Resp: Distant breath sounds, no respiratory distress Cardio: Regular rate and rhythm GI: Obese, no hepatosplenomegaly Vascular: The right lower leg is slightly larger than the left side, no erythema or tenderness, right knee brace in place    Lab Results:  Lab Results  Component Value Date   WBC 10.3 08/31/2020   HGB 12.1 08/31/2020   HCT 38.4 08/31/2020   MCV 81.0 08/31/2020   PLT 378 08/31/2020   NEUTROABS 6.6 08/31/2020    CMP  Lab Results  Component Value Date   NA 139 01/04/2020   K 3.8 01/04/2020   CL 109 01/04/2020   CO2 23 01/04/2020   GLUCOSE 97 01/04/2020   BUN 9 01/04/2020   CREATININE 1.05 (H) 01/04/2020   CALCIUM 8.9 01/04/2020   PROT 7.6 01/04/2020   ALBUMIN 3.5 01/04/2020   AST 14 (L) 01/04/2020   ALT 19 01/04/2020   ALKPHOS 88 01/04/2020   BILITOT 0.4 01/04/2020   GFRNONAA >60 01/04/2020   GFRAA >60 01/04/2020     Imaging:  DG Knee Complete 4 Views Right  Result Date: 08/29/2020 CLINICAL DATA:  Fall several weeks ago with persistent right knee pain, initial encounter EXAM: RIGHT KNEE - COMPLETE 4+ VIEW COMPARISON:  None. FINDINGS: No acute fracture or dislocation is seen. Mild degenerative changes are noted. No joint effusion is seen. IMPRESSION: Degenerative change without acute abnormality.  Electronically Signed   By: 08/31/2020 M.D.   On: 08/29/2020 23:59    Medications: I have reviewed the patient's current medications.   Assessment/Plan: 1. History of iron deficiency anemia felt to be secondary to menorrhagia-improved 2. B12 deficiency started on monthly B12 injections 07/19/2019, now taking oral vitamin B12 3. History of menorrhagia on Megace, recurrent heavy vaginal bleeding beginning 12/29/2019 4. 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 Obstructive sleep apnea 6. Obesity 7. Upper endoscopy/colonoscopy 12/30/2019-erythema in the stomach, biopsy confirmed H. Pylori 8. Covid + 11/23/2020    Disposition: Ms. 11/25/2020 has a normal hemoglobin today.  We will follow up on the vitamin B12 and iron levels.  She will continue B12 and iron supplements.  She will return for an office visit and CBC in 6 months.  She will follow up with her primary provider for management of the right knee pain.  She is scheduled for a right lower extremity ultrasound next week.  I have a low clinical suspicion for a deep vein thrombosis today.  She will seek medical attention for leg swelling or pain.  Hilary Hertz, MD  08/31/2020  9:00 AM

## 2020-08-31 NOTE — Telephone Encounter (Signed)
Scheduled appointments per 9/23 los. Spoke to patient who is aware of appointment date and time.  

## 2020-09-04 ENCOUNTER — Telehealth: Payer: Self-pay | Admitting: *Deleted

## 2020-09-04 ENCOUNTER — Ambulatory Visit
Admission: RE | Admit: 2020-09-04 | Discharge: 2020-09-04 | Disposition: A | Discharge status: Home / Self Care | Payer: BC Managed Care – PPO | Source: Ambulatory Visit | Attending: Student | Admitting: Student

## 2020-09-04 DIAGNOSIS — R609 Edema, unspecified: Secondary | ICD-10-CM

## 2020-09-04 NOTE — Telephone Encounter (Signed)
Patient called asking if MD was going to order further study to r/o DVT? Has not heard from anyone for the testing appointment.

## 2020-09-05 NOTE — Telephone Encounter (Signed)
Per Dr. Truett Perna, the soft tissue ultrasound yesterday showed no abnormality. She should continue to f/u with Legrand Pitts, FNP that ordered the test for any further workup. Left this message on her voice mail.

## 2021-01-16 IMAGING — US US EXTREM LOW*R* LIMITED
1 series · 10 of 10 positions shown · non-contrast
Comparison: None.

CLINICAL DATA: Soft tissue swelling in the knee region posteriorly
and medially. Fall approximately 6 weeks prior.

EXAM:
ULTRASOUND RIGHT POPLITEAL FOSSA REGION
TECHNIQUE: Longitudinal and transverse images were obtained in the popliteal
space region on the right with particular attention to the more
medial aspect where patient reports maximum clinical symptoms.

[Series 1: us extrem low*right* limited · 0.10mm/px · 10 of 10 slices shown]
[im 1/10]
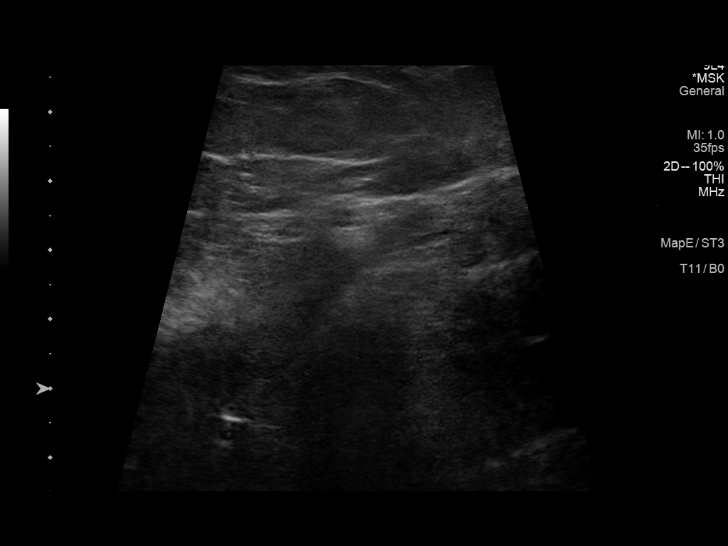
[im 2/10]
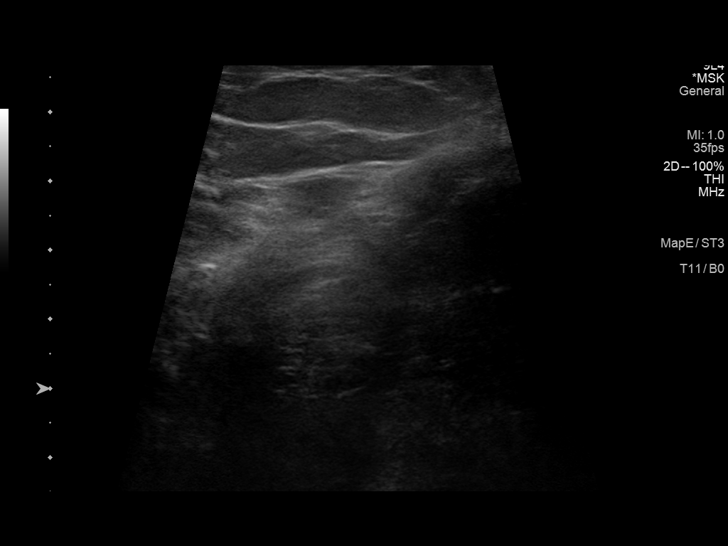
[im 3/10]
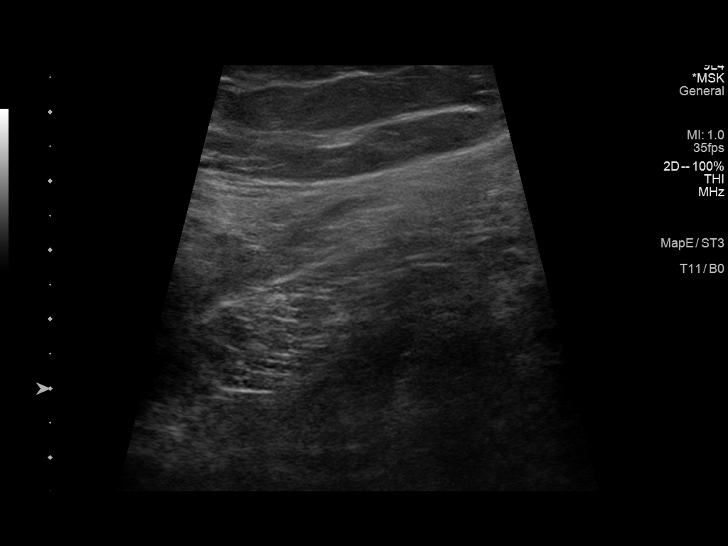
[im 4/10]
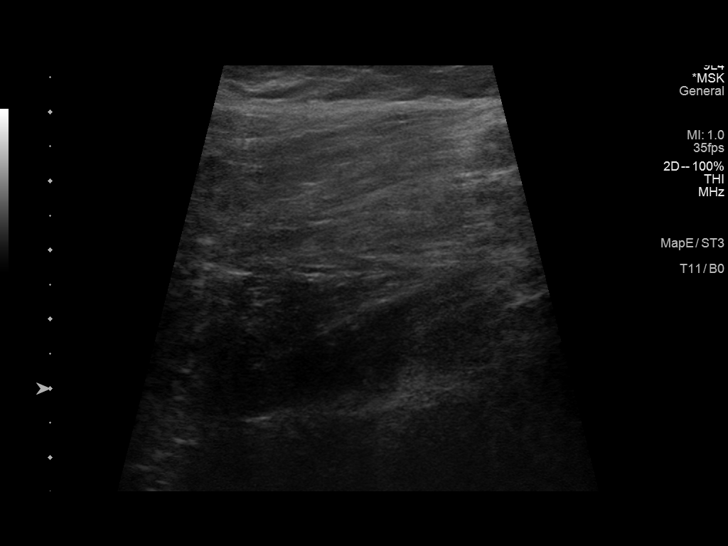
[im 5/10]
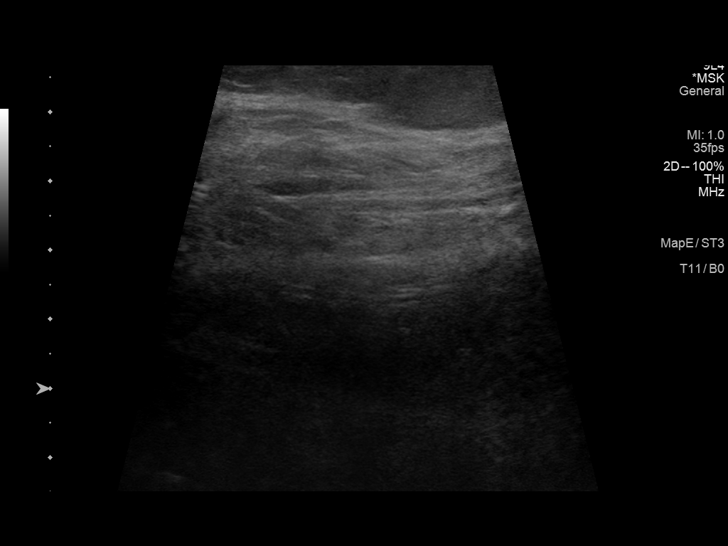
[im 6/10]
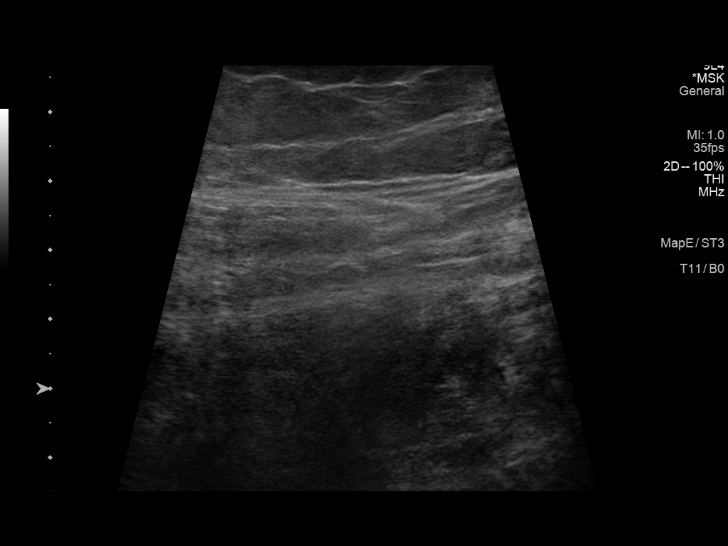
[im 7/10]
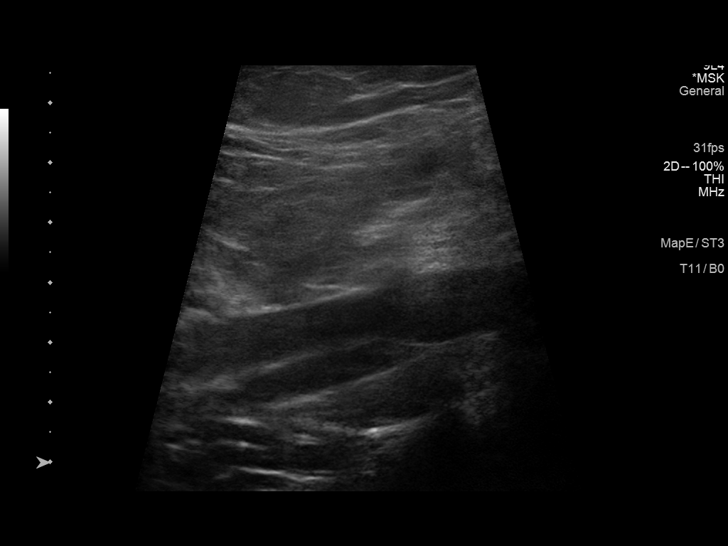
[im 8/10]
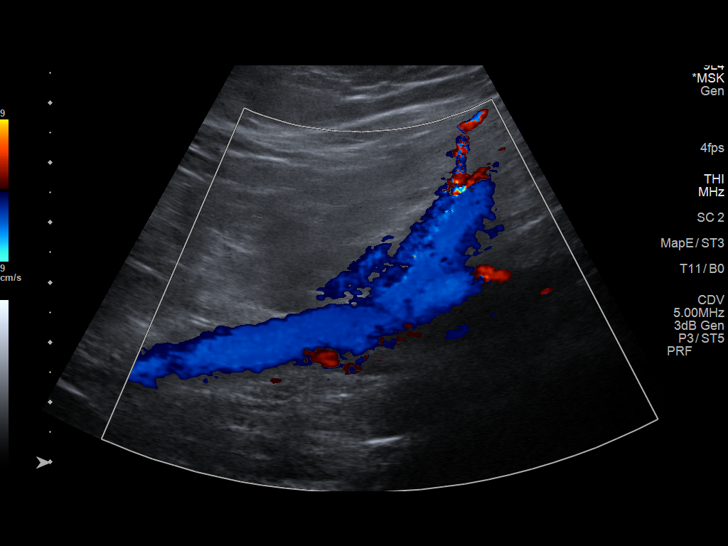
[im 9/10]
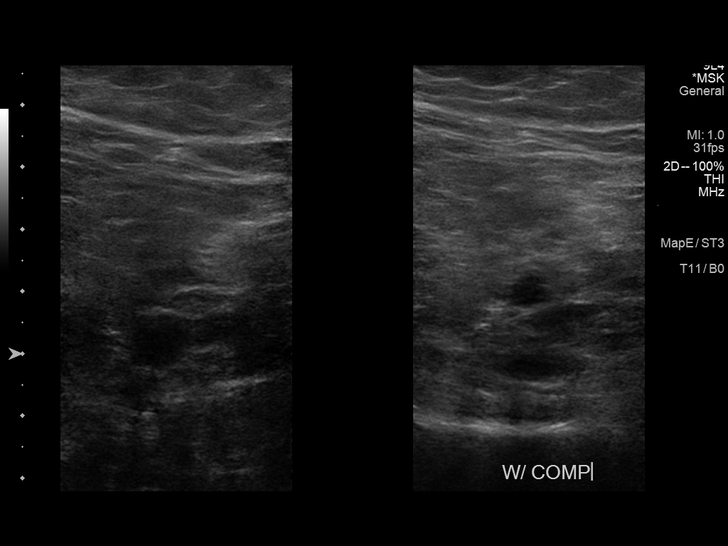
[im 10/10]
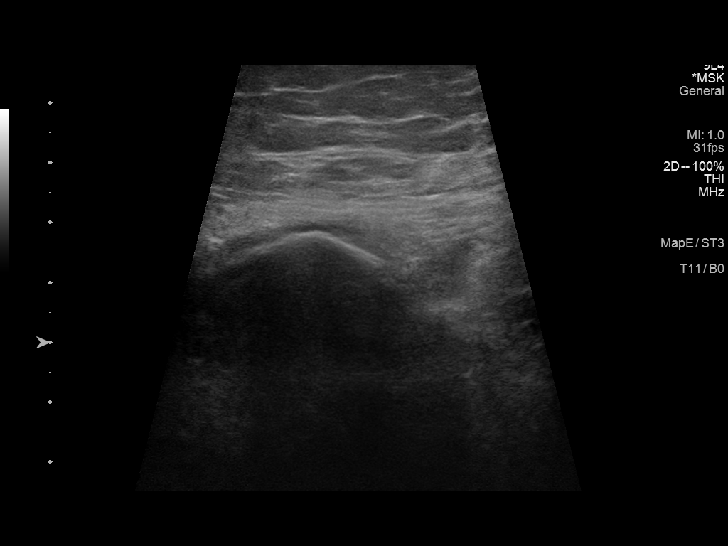

[10 of 10 positions shown; findings below may reference images not displayed]

FINDINGS: There is no evident mass or fluid collection. No inflammatory focus
is evident in the right popliteal region.
IMPRESSION: No abnormality appreciable by ultrasound in the right popliteal
fossa region.

## 2021-02-27 ENCOUNTER — Inpatient Hospital Stay: Payer: BC Managed Care – PPO | Attending: Nurse Practitioner

## 2021-02-27 ENCOUNTER — Inpatient Hospital Stay: Payer: BC Managed Care – PPO | Admitting: Oncology

## 2021-02-28 ENCOUNTER — Other Ambulatory Visit: Payer: BC Managed Care – PPO

## 2021-02-28 ENCOUNTER — Ambulatory Visit: Payer: BC Managed Care – PPO | Admitting: Nurse Practitioner

## 2021-02-28 ENCOUNTER — Telehealth: Payer: Self-pay | Admitting: Oncology

## 2021-02-28 NOTE — Telephone Encounter (Signed)
Scheduled appts per 3/22 sch msg. Called pt, no answer. Left msg with appts date and times. Informed pt appts would be at the [facility] location.

## 2021-03-30 ENCOUNTER — Inpatient Hospital Stay: Payer: BC Managed Care – PPO | Attending: Nurse Practitioner

## 2021-03-30 ENCOUNTER — Inpatient Hospital Stay: Payer: BC Managed Care – PPO | Admitting: Oncology

## 2021-04-30 ENCOUNTER — Inpatient Hospital Stay: Payer: BC Managed Care – PPO | Admitting: Oncology

## 2021-04-30 ENCOUNTER — Inpatient Hospital Stay: Payer: BC Managed Care – PPO | Attending: Nurse Practitioner

## 2021-11-05 ENCOUNTER — Other Ambulatory Visit: Payer: Self-pay | Admitting: *Deleted

## 2021-11-05 DIAGNOSIS — D508 Other iron deficiency anemias: Secondary | ICD-10-CM

## 2021-11-05 NOTE — Progress Notes (Signed)
Scheduling message sent to call patient and schedule F/U appt here at [facility] with labs same day. Orders entered

## 2021-11-06 ENCOUNTER — Telehealth: Payer: Self-pay | Admitting: Oncology

## 2021-11-06 NOTE — Telephone Encounter (Signed)
Contacted patient to rescheduled missed appointments. Patient stated she no longer needed appointments. She is seeing Dr.Crane is [place].

## 2024-04-11 ENCOUNTER — Encounter (HOSPITAL_BASED_OUTPATIENT_CLINIC_OR_DEPARTMENT_OTHER): Payer: Self-pay | Admitting: *Deleted

## 2024-04-11 ENCOUNTER — Other Ambulatory Visit: Payer: Self-pay

## 2024-04-11 ENCOUNTER — Encounter: Payer: Self-pay | Admitting: Internal Medicine

## 2024-04-11 ENCOUNTER — Emergency Department (HOSPITAL_BASED_OUTPATIENT_CLINIC_OR_DEPARTMENT_OTHER)
Admission: EM | Admit: 2024-04-11 | Discharge: 2024-04-12 | Disposition: A | Discharge status: Home / Self Care | Attending: Emergency Medicine | Admitting: Emergency Medicine

## 2024-04-11 DIAGNOSIS — R7401 Elevation of levels of liver transaminase levels: Secondary | ICD-10-CM | POA: Diagnosis not present

## 2024-04-11 DIAGNOSIS — R1110 Vomiting, unspecified: Secondary | ICD-10-CM | POA: Diagnosis not present

## 2024-04-11 DIAGNOSIS — E162 Hypoglycemia, unspecified: Secondary | ICD-10-CM | POA: Diagnosis not present

## 2024-04-11 DIAGNOSIS — Z7984 Long term (current) use of oral hypoglycemic drugs: Secondary | ICD-10-CM | POA: Diagnosis not present

## 2024-04-11 DIAGNOSIS — R531 Weakness: Secondary | ICD-10-CM | POA: Insufficient documentation

## 2024-04-11 DIAGNOSIS — R7989 Other specified abnormal findings of blood chemistry: Secondary | ICD-10-CM

## 2024-04-11 DIAGNOSIS — R5383 Other fatigue: Secondary | ICD-10-CM | POA: Insufficient documentation

## 2024-04-11 DIAGNOSIS — R42 Dizziness and giddiness: Secondary | ICD-10-CM | POA: Diagnosis not present

## 2024-04-11 LAB — CBG MONITORING, ED
Glucose-Capillary: 80 mg/dL (ref 70–99)
Glucose-Capillary: 77 mg/dL (ref 70–99)

## 2024-04-11 NOTE — ED Provider Notes (Signed)
 [place] [facility] Provider Note   CSN: [id number] Arrival date & time: 04/11/24  1716     History {Add pertinent medical, surgical, social history, OB history to HPI:1} Chief Complaint  Patient presents with   Hypoglycemia    Alexandra Rose is a 43 y.o. female.  Patient reports low blood sugars for the past 2 days.  Has a history of gastric bypass in 2023 and has a Dexcom but is not diabetic.  Denies taking insulin or medications to control her blood sugar.  States her sugars have been up and down as low as the 40s for the past 2 days associated with generalized weakness and lightheadedness.  Her endocrinologist has told her to eat small frequent meals which she has been doing.  Denies any thinner tonight.  Endorses feeling fatigued and lightheaded.  Gradual onset headache since she arrived.  No thunderclap onset.  No chest pain or shortness of breath.  No abdominal pain.  Did have 2 episodes of vomiting today.  No fever.  No pain with urination or blood in the urine.  No focal weakness, numbness or tingling.  She is blind at baseline.  States she is continues to feel weak and lightheaded.  Blood sugars have been 70s to 80s since she was waiting for the past 6 hours.   The history is provided by the patient.  Hypoglycemia Associated symptoms: vomiting and weakness   Associated symptoms: no dizziness and no shortness of breath        Home Medications Prior to Admission medications   Medication Sig Start Date End Date Taking? Authorizing Provider  Ascorbic Acid (VITAMIN C) 100 MG tablet Vitamin C    [provider]  cetirizine (ZYRTEC) 10 MG tablet Take 10 mg by mouth daily.  03/15/19   [provider]  EPINEPHrine 0.3 mg/0.3 mL IJ SOAJ injection Inject into the muscle as needed. Patient not taking: Reported on 08/31/2020 11/22/19   [provider]  ergocalciferol (VITAMIN D2) 1.25 MG (50000 UT) capsule Take 50,000 Units  by mouth once a week. 08/08/20   [provider]  ferrous gluconate (FERGON) 324 MG tablet Take 324 mg by mouth daily. 04/24/20   [provider]  Ipratropium-Albuterol (COMBIVENT RESPIMAT) 20-100 MCG/ACT AERS respimat Inhale 1 puff into the lungs every 6 (six) hours.    [provider]  megestrol  (MEGACE ) 40 MG tablet Take 1 tablet (40 mg total) by mouth 2 (two) times daily. 01/18/20   Constant, Peggy, MD  metFORMIN (GLUCOPHAGE-XR) 500 MG 24 hr tablet Take 500 mg by mouth daily. 08/08/20   [provider]  omeprazole  (PRILOSEC) 40 MG capsule Take 1 capsule (40 mg total) by mouth daily. Patient not taking: Reported on 08/31/2020 01/10/20   Mansouraty, Albino Alu., MD  polyethylene glycol (MIRALAX / GLYCOLAX) 17 g packet TK 17 G PO D FOR 3 DAYS 05/20/19   [provider]  vitamin B-12 (CYANOCOBALAMIN ) 1000 MCG tablet Take 1,000 mcg by mouth daily.    [provider]      Allergies    Tomato and Shellfish allergy    Review of Systems   Review of Systems  Constitutional:  Positive for fatigue. Negative for activity change, appetite change and fever.  HENT:  Negative for congestion and rhinorrhea.   Respiratory:  Negative for cough, chest tightness and shortness of breath.   Cardiovascular:  Negative for chest pain and palpitations.  Gastrointestinal:  Positive for nausea and vomiting.  Negative for abdominal pain.  Genitourinary:  Negative for dysuria and hematuria.  Musculoskeletal:  Negative for arthralgias and myalgias.  Skin:  Negative for rash.  Neurological:  Positive for weakness and light-headedness. Negative for dizziness.    all other systems are negative except as noted in the HPI and PMH.   Physical Exam Updated Vital Signs BP 128/73   Pulse 71   Temp 98.9 F (37.2 C) (Oral)   Resp 16   SpO2 100%  Physical Exam Vitals and nursing note reviewed.  Constitutional:      General: She is not in acute distress.    Appearance: She  is well-developed.  HENT:     Head: Normocephalic and atraumatic.     Mouth/Throat:     Pharynx: No oropharyngeal exudate.  Eyes:     Conjunctiva/sclera: Conjunctivae normal.     Pupils: Pupils are equal, round, and reactive to light.     Comments: Legally blind, left eye strabismus, baseline per patient  Neck:     Comments: No meningismus. Cardiovascular:     Rate and Rhythm: Normal rate and regular rhythm.     Heart sounds: Normal heart sounds. No murmur heard. Pulmonary:     Effort: Pulmonary effort is normal. No respiratory distress.     Breath sounds: Normal breath sounds.  Abdominal:     Palpations: Abdomen is soft.     Tenderness: There is no abdominal tenderness. There is no guarding or rebound.  Musculoskeletal:        General: No tenderness. Normal range of motion.     Cervical back: Normal range of motion and neck supple.  Skin:    General: Skin is warm.  Neurological:     Mental Status: She is alert and oriented to person, place, and time.     Cranial Nerves: No cranial nerve deficit.     Motor: No abnormal muscle tone.     Coordination: Coordination normal.     Comments:  5/5 strength throughout. CN 2-12 intact.Equal grip strength.   Psychiatric:        Behavior: Behavior normal.     ED Results / Procedures / Treatments   Labs (all labs ordered are listed, but only abnormal results are displayed) Labs Reviewed  CBC WITH DIFFERENTIAL/PLATELET  COMPREHENSIVE METABOLIC PANEL WITH GFR  TSH  URINALYSIS, ROUTINE W REFLEX MICROSCOPIC  CBG MONITORING, ED  CBG MONITORING, ED  TROPONIN T, HIGH SENSITIVITY    EKG None  Radiology No results found.  Procedures Procedures  {Document cardiac monitor, telemetry assessment procedure when appropriate:1}  Medications Ordered in ED Medications - No data to display  ED Course/ Medical Decision Making/ A&P   {   Click here for ABCD2, HEART and other calculatorsREFRESH Note before signing :1}                               Medical Decision Making Amount and/or Complexity of Data Reviewed Labs: ordered. Decision-making details documented in ED Course. Radiology: ordered and independent interpretation performed. Decision-making details documented in ED Course. ECG/medicine tests: ordered and independent interpretation performed. Decision-making details documented in ED Course.   Patient with hyperglycemia for the past several days.  Vitals are stable.  No distress.  No fever.  Abdomen soft without peritoneal signs.  She is legally blind at baseline.  Patient has been waiting for about 6 hours and her Dexcom shows showed sugar readings in the 70s and  80s.  No profound hypoglycemia.  Will check labs to assure no anemia.  {Document critical care time when appropriate:1} {Document review of labs and clinical decision tools ie heart score, Chads2Vasc2 etc:1}  {Document your independent review of radiology images, and any outside records:1} {Document your discussion with family members, caretakers, and with consultants:1} {Document social determinants of health affecting pt's care:1} {Document your decision making why or why not admission, treatments were needed:1} Final Clinical Impression(s) / ED Diagnoses Final diagnoses:  None    Rx / DC Orders ED Discharge Orders     None

## 2024-04-11 NOTE — ED Notes (Signed)
 Pt has dexcom and her blood sugars have been in 80's and 90's while in waiting are for the past 4 hours

## 2024-04-11 NOTE — ED Triage Notes (Addendum)
 Pt arrives due to episodes of hypoglycemia over the past two days.  Pt states that she has had several readings that read "under 40".  Pt states that she is not on any insulin or diabetic medication but has a dex com due to being post gastric bypass and needing to watch her blood sugar.  Pt is blind.  CBG 80 in triage

## 2024-04-11 NOTE — ED Notes (Signed)
 Attempt blood draw in triage in left hand without success

## 2024-04-11 NOTE — ED Notes (Addendum)
 Pt presents with Hypoglycemia - episodes- Pt has had readings in 40s. Pt states this has been going on for 2 days. Hx of gastric bypass Sept 2023. Pt has been experiencing hypoglycemia off/on since sx.. Wears dexcom to monitor glucose. Pt states she feels very weak and tired.

## 2024-04-12 ENCOUNTER — Ambulatory Visit (HOSPITAL_BASED_OUTPATIENT_CLINIC_OR_DEPARTMENT_OTHER)
Admission: RE | Admit: 2024-04-12 | Discharge: 2024-04-12 | Disposition: A | Discharge status: Home / Self Care | Source: Ambulatory Visit | Attending: Emergency Medicine | Admitting: Emergency Medicine

## 2024-04-12 ENCOUNTER — Encounter (HOSPITAL_BASED_OUTPATIENT_CLINIC_OR_DEPARTMENT_OTHER): Payer: Self-pay

## 2024-04-12 ENCOUNTER — Emergency Department (HOSPITAL_BASED_OUTPATIENT_CLINIC_OR_DEPARTMENT_OTHER)

## 2024-04-12 ENCOUNTER — Other Ambulatory Visit (HOSPITAL_BASED_OUTPATIENT_CLINIC_OR_DEPARTMENT_OTHER): Payer: Self-pay | Admitting: Emergency Medicine

## 2024-04-12 DIAGNOSIS — R7989 Other specified abnormal findings of blood chemistry: Secondary | ICD-10-CM | POA: Diagnosis present

## 2024-04-12 DIAGNOSIS — R531 Weakness: Secondary | ICD-10-CM | POA: Diagnosis not present

## 2024-04-12 DIAGNOSIS — E162 Hypoglycemia, unspecified: Secondary | ICD-10-CM

## 2024-04-12 LAB — CBC WITH DIFFERENTIAL/PLATELET
Abs Immature Granulocytes: 0.02 10*3/uL (ref 0.00–0.07)
Basophils Absolute: 0 10*3/uL (ref 0.0–0.1)
Basophils Relative: 0 %
Eosinophils Absolute: 0.1 10*3/uL (ref 0.0–0.5)
Eosinophils Relative: 1 %
HCT: 39 % (ref 36.0–46.0)
Hemoglobin: 13.1 g/dL (ref 12.0–15.0)
Immature Granulocytes: 0 %
Lymphocytes Relative: 43 %
Lymphs Abs: 3.6 10*3/uL (ref 0.7–4.0)
MCH: 31.3 pg (ref 26.0–34.0)
MCHC: 33.6 g/dL (ref 30.0–36.0)
MCV: 93.1 fL (ref 80.0–100.0)
Monocytes Absolute: 0.5 10*3/uL (ref 0.1–1.0)
Monocytes Relative: 6 %
Neutro Abs: 4.3 10*3/uL (ref 1.7–7.7)
Neutrophils Relative %: 50 %
Platelets: 292 10*3/uL (ref 150–400)
RBC: 4.19 MIL/uL (ref 3.87–5.11)
RDW: 13.9 % (ref 11.5–15.5)
WBC: 8.6 10*3/uL (ref 4.0–10.5)
nRBC: 0 % (ref 0.0–0.2)

## 2024-04-12 LAB — COMPREHENSIVE METABOLIC PANEL WITH GFR
ALT: 131 U/L — ABNORMAL HIGH (ref 0–44)
AST: 64 U/L — ABNORMAL HIGH (ref 15–41)
Albumin: 4.5 g/dL (ref 3.5–5.0)
Alkaline Phosphatase: 134 U/L — ABNORMAL HIGH (ref 38–126)
Anion gap: 15 (ref 5–15)
BUN: 13 mg/dL (ref 6–20)
CO2: 21 mmol/L — ABNORMAL LOW (ref 22–32)
Calcium: 9.5 mg/dL (ref 8.9–10.3)
Chloride: 107 mmol/L (ref 98–111)
Creatinine, Ser: 0.84 mg/dL (ref 0.44–1.00)
GFR, Estimated: >60 mL/min (ref >60)
Glucose, Bld: 92 mg/dL (ref 70–99)
Potassium: 3.8 mmol/L (ref 3.5–5.1)
Sodium: 142 mmol/L (ref 135–145)
Total Bilirubin: 0.6 mg/dL (ref 0.0–1.2)
Total Protein: 8 g/dL (ref 6.5–8.1)

## 2024-04-12 LAB — URINALYSIS, MICROSCOPIC (REFLEX)

## 2024-04-12 LAB — RESP PANEL BY RT-PCR (RSV, FLU A&B, COVID)  RVPGX2
Influenza A by PCR: NEGATIVE
Influenza B by PCR: NEGATIVE
Resp Syncytial Virus by PCR: NEGATIVE
SARS Coronavirus 2 by RT PCR: NEGATIVE

## 2024-04-12 LAB — URINALYSIS, ROUTINE W REFLEX MICROSCOPIC
Bilirubin Urine: NEGATIVE
Glucose, UA: NEGATIVE mg/dL
Ketones, ur: NEGATIVE mg/dL
Leukocytes,Ua: NEGATIVE
Nitrite: NEGATIVE
Protein, ur: NEGATIVE mg/dL
Specific Gravity, Urine: 1.025 (ref 1.005–1.030)
pH: 5.5 (ref 5.0–8.0)

## 2024-04-12 LAB — TROPONIN T, HIGH SENSITIVITY
Troponin T High Sensitivity: <15 ng/L (ref <19)
Troponin T High Sensitivity: <15 ng/L (ref <19)

## 2024-04-12 LAB — CBG MONITORING, ED
Glucose-Capillary: 117 mg/dL — ABNORMAL HIGH (ref 70–99)
Glucose-Capillary: 88 mg/dL (ref 70–99)

## 2024-04-12 LAB — TSH: TSH: 3.4 u[IU]/mL (ref 0.350–4.500)

## 2024-04-12 LAB — PREGNANCY, URINE: Preg Test, Ur: NEGATIVE

## 2024-04-12 MED ORDER — IOHEXOL 300 MG/ML  SOLN
100.0000 mL | Freq: Once | INTRAMUSCULAR | Status: AC | PRN
  Administered 2024-04-12: 100 mL via INTRAVENOUS

## 2024-04-12 MED ORDER — METRONIDAZOLE 500 MG PO TABS
2000.0000 mg | ORAL_TABLET | Freq: Once | ORAL | Status: AC
  Administered 2024-04-12: 2000 mg via ORAL
  Filled 2024-04-12: qty 4

## 2024-04-12 NOTE — ED Notes (Signed)
CBG 117 

## 2024-04-12 NOTE — ED Notes (Signed)
 At 0207 - Patient CBG 117 and at 0231 CBG 88. Gave Graham crackers.

## 2024-04-12 NOTE — ED Notes (Signed)
 Pt requested graham crackers, peanut butter and twist to drink. OK per RN Russ Course. Pt given the same.

## 2024-04-12 NOTE — Discharge Instructions (Signed)
 Frequent small meals as you have discussed with your endocrinologist and watch your blood sugars carefully.  Your LFTs today are slightly elevated you should follow-up tomorrow to have an ultrasound of her gallbladder to ensure there is no gallstones.  Your sexual partner should make sure they are treated for trichomonas as well. Return to the ED with new or worsening symptoms.

## 2024-05-19 ENCOUNTER — Encounter: Payer: Self-pay | Admitting: Internal Medicine
